# Patient Record
Sex: Female | Born: 1965 | Hispanic: Yes | Marital: Married | Smoking: Never smoker
Health system: Southern US, Community
[De-identification: ages and names within clinical notes are randomized; demographics above are authoritative.]

## PROBLEM LIST (undated history)

## (undated) DIAGNOSIS — T8859XA Other complications of anesthesia, initial encounter: Secondary | ICD-10-CM

## (undated) DIAGNOSIS — R112 Nausea with vomiting, unspecified: Secondary | ICD-10-CM

## (undated) DIAGNOSIS — R7611 Nonspecific reaction to tuberculin skin test without active tuberculosis: Secondary | ICD-10-CM

## (undated) DIAGNOSIS — Z8711 Personal history of peptic ulcer disease: Secondary | ICD-10-CM

## (undated) DIAGNOSIS — D649 Anemia, unspecified: Secondary | ICD-10-CM

## (undated) DIAGNOSIS — N393 Stress incontinence (female) (male): Secondary | ICD-10-CM

## (undated) DIAGNOSIS — J302 Other seasonal allergic rhinitis: Secondary | ICD-10-CM

## (undated) DIAGNOSIS — Z9889 Other specified postprocedural states: Secondary | ICD-10-CM

## (undated) DIAGNOSIS — K6289 Other specified diseases of anus and rectum: Secondary | ICD-10-CM

## (undated) DIAGNOSIS — R7303 Prediabetes: Secondary | ICD-10-CM

## (undated) DIAGNOSIS — E559 Vitamin D deficiency, unspecified: Secondary | ICD-10-CM

## (undated) DIAGNOSIS — K219 Gastro-esophageal reflux disease without esophagitis: Secondary | ICD-10-CM

## (undated) DIAGNOSIS — F419 Anxiety disorder, unspecified: Secondary | ICD-10-CM

## (undated) DIAGNOSIS — T4145XA Adverse effect of unspecified anesthetic, initial encounter: Secondary | ICD-10-CM

## (undated) HISTORY — DX: Vitamin D deficiency, unspecified: E55.9

## (undated) HISTORY — DX: Gastro-esophageal reflux disease without esophagitis: K21.9

## (undated) HISTORY — DX: Stress incontinence (female) (male): N39.3

## (undated) HISTORY — DX: Other specified diseases of anus and rectum: K62.89

## (undated) HISTORY — DX: Personal history of peptic ulcer disease: Z87.11

## (undated) HISTORY — PX: MIDDLE EAR SURGERY: SHX713

## (undated) HISTORY — DX: Nonspecific reaction to tuberculin skin test without active tuberculosis: R76.11

## (undated) HISTORY — DX: Other seasonal allergic rhinitis: J30.2

## (undated) HISTORY — PX: CYST EXCISION: SHX5701

---
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
## 2004-11-27 ENCOUNTER — Other Ambulatory Visit: Admission: RE | Admit: 2004-11-27 | Discharge: 2004-11-27 | Payer: Self-pay | Admitting: Gynecology

## 2005-03-13 ENCOUNTER — Encounter: Admission: RE | Admit: 2005-03-13 | Discharge: 2005-03-13 | Payer: Self-pay | Admitting: Gynecology

## 2006-06-11 ENCOUNTER — Other Ambulatory Visit: Admission: RE | Admit: 2006-06-11 | Discharge: 2006-06-11 | Payer: Self-pay | Admitting: Gynecology

## 2007-05-04 ENCOUNTER — Encounter: Admission: RE | Admit: 2007-05-04 | Discharge: 2007-05-04 | Payer: Self-pay | Admitting: Gynecology

## 2007-07-27 ENCOUNTER — Other Ambulatory Visit: Admission: RE | Admit: 2007-07-27 | Discharge: 2007-07-27 | Payer: Self-pay | Admitting: Gynecology

## 2008-05-08 ENCOUNTER — Encounter: Admission: RE | Admit: 2008-05-08 | Discharge: 2008-05-08 | Payer: Self-pay | Admitting: Gynecology

## 2008-12-08 ENCOUNTER — Encounter: Admission: RE | Admit: 2008-12-08 | Discharge: 2008-12-08 | Payer: Self-pay | Admitting: Internal Medicine

## 2010-02-01 ENCOUNTER — Encounter: Admission: RE | Admit: 2010-02-01 | Discharge: 2010-02-01 | Payer: Self-pay | Admitting: Internal Medicine

## 2010-06-02 ENCOUNTER — Encounter: Payer: Self-pay | Admitting: Internal Medicine

## 2010-08-11 DIAGNOSIS — K6289 Other specified diseases of anus and rectum: Secondary | ICD-10-CM

## 2010-08-11 HISTORY — DX: Other specified diseases of anus and rectum: K62.89

## 2010-09-05 ENCOUNTER — Other Ambulatory Visit: Payer: Self-pay | Admitting: Gastroenterology

## 2010-09-05 DIAGNOSIS — K6289 Other specified diseases of anus and rectum: Secondary | ICD-10-CM

## 2010-09-05 HISTORY — PX: COLONOSCOPY: SHX174

## 2010-09-10 ENCOUNTER — Other Ambulatory Visit: Payer: Self-pay

## 2010-09-13 ENCOUNTER — Ambulatory Visit
Admission: RE | Admit: 2010-09-13 | Discharge: 2010-09-13 | Disposition: A | Discharge status: Home / Self Care | Payer: 59 | Source: Ambulatory Visit | Attending: Gastroenterology | Admitting: Gastroenterology

## 2010-09-13 DIAGNOSIS — K6289 Other specified diseases of anus and rectum: Secondary | ICD-10-CM

## 2010-09-13 MED ORDER — IOHEXOL 300 MG/ML  SOLN
100.0000 mL | Freq: Once | INTRAMUSCULAR | Status: AC | PRN
  Administered 2010-09-13: 100 mL via INTRAVENOUS

## 2011-05-01 ENCOUNTER — Ambulatory Visit (INDEPENDENT_AMBULATORY_CARE_PROVIDER_SITE_OTHER): Payer: 59 | Admitting: Family Medicine

## 2011-05-01 ENCOUNTER — Encounter: Payer: Self-pay | Admitting: Family Medicine

## 2011-05-01 VITALS — BP 118/80 | HR 80 | Temp 98.8°F | Ht 62.0 in | Wt 195.8 lb

## 2011-05-01 DIAGNOSIS — Z23 Encounter for immunization: Secondary | ICD-10-CM

## 2011-05-01 DIAGNOSIS — R109 Unspecified abdominal pain: Secondary | ICD-10-CM

## 2011-05-01 DIAGNOSIS — K828 Other specified diseases of gallbladder: Secondary | ICD-10-CM | POA: Insufficient documentation

## 2011-05-01 DIAGNOSIS — R1011 Right upper quadrant pain: Secondary | ICD-10-CM

## 2011-05-01 DIAGNOSIS — Z8711 Personal history of peptic ulcer disease: Secondary | ICD-10-CM

## 2011-05-01 DIAGNOSIS — Z0000 Encounter for general adult medical examination without abnormal findings: Secondary | ICD-10-CM

## 2011-05-01 DIAGNOSIS — R7611 Nonspecific reaction to tuberculin skin test without active tuberculosis: Secondary | ICD-10-CM | POA: Insufficient documentation

## 2011-05-01 DIAGNOSIS — R310 Gross hematuria: Secondary | ICD-10-CM

## 2011-05-01 LAB — POCT URINALYSIS DIPSTICK
Bilirubin, UA: NEGATIVE
Glucose, UA: NEGATIVE
Ketones, UA: NEGATIVE
Leukocytes, UA: NEGATIVE
Nitrite, UA: NEGATIVE
Protein, UA: NEGATIVE
Urobilinogen, UA: 0.2
pH, UA: 6

## 2011-05-01 MED ORDER — OMEPRAZOLE 40 MG PO CPDR: 40.0000 mg | DELAYED_RELEASE_CAPSULE | Freq: Every day | ORAL | Status: DC

## 2011-05-01 NOTE — Assessment & Plan Note (Signed)
Has several day hx RUQ pain, and h/o PUD. Anticipate dyspepsia/GERD, possible gastritis/ulcer. Treat with PPI x 3 wks, will request records from [facility] ([facility]). Check blood work when returns fasting. Reassess at CPE. Not typical of biliary colic however if not improving with PPI, next step would be gallbladder eval/abd [place].

## 2011-05-01 NOTE — Patient Instructions (Addendum)
Sounds like dyspepsia treat with omeprazole 40mg  daily for 3 weeks. Return at your convenience fasting for blood work, a few days afterwards for physical. If not better, let us know for abdominal ultrasound. Flu shot today. We will request records from Dr. Dulce Sellar and prior PCP Dr. Kelly Splinter. Return off period for repeat urine to ensure no blood in urine

## 2011-05-01 NOTE — Progress Notes (Signed)
Subjective:    Patient ID: Julie Vazquez, female    DOB: 1965/08/26, 45 y.o.   MRN: [id number]  HPI CC: new pt establish  Prior saw Dr. Velna Hatchet.  Wanted to change.  R abd pain - h/o PUD, remotely.  5d h/o RUQ pain worse after drinking milk/cheese.  Sharp stabbing pain, achey.  + gassy and bloated.  No indigestion.  Mild heartburn if eating dinner too close to bedtime.  First time started Monday 9am at work, lasted whole day.  Then eased off and has had intermittently since then but not as bad.  Avoids fatty/greasy foods.  No fevers/chills, dysuria, urgency.  + weight gain.  + frequency and urgency and nocturia for several months.  Told has stress incontinence in past.  URTI last month, treated with abx, completed coughs.  Residual cough.  Wakes up with mild sputum.  Rare allergy sxs, some GERD sxs.  Preventative: No recent physical, due for this. Well woman exam yearly with OBGYN (Dr. Newt Minion), normal pap smears last 07/2010.  H/o fibroids. Mammogram coming up 04/2011 Flu shot - would like today Tetanus - UTD Colonoscopy - Dr. Dulce Sellar, for rectal pain, told normal.  H/o CT scan as well.  Medications and allergies reviewed and updated in chart.  Past histories reviewed and updated if relevant as below. There is no problem list on file for this patient.  Past Medical History  Diagnosis Date  . Anemia   . PUD (peptic ulcer disease)     remote  . Seasonal allergies   . 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 Cesarean section   . Middle ear surgery    History  Substance Use Topics  . Smoking status: Never Smoker   . Smokeless tobacco: Never Used  . Alcohol Use: Yes     Social   Family History  Problem Relation Age of Onset  . Heart disease Mother   . Diabetes Mother   . Cancer Maternal Aunt     uterine/ovarian  . Miscarriages / Stillbirths Maternal Uncle   . Diabetes Maternal  Grandmother   . Coronary artery disease Maternal Grandfather     MI  . Cancer Cousin     breast   No Known Allergies No current outpatient prescriptions on file prior to visit.   Review of Systems  Constitutional: Negative for fever, chills, activity change, appetite change, fatigue and unexpected weight change.  HENT: Negative for hearing loss and neck pain.   Respiratory: Positive for cough. Negative for chest tightness, shortness of breath and wheezing.   Cardiovascular: Positive for leg swelling. Negative for chest pain and palpitations.  Gastrointestinal: Positive for nausea and abdominal pain. Negative for vomiting, diarrhea, constipation, blood in stool and abdominal distention.  Genitourinary: Negative for hematuria and difficulty urinating.  Musculoskeletal: Negative for myalgias and arthralgias.  Skin: Negative for rash.  Neurological: Positive for headaches. Negative for dizziness, seizures and syncope.  Hematological: Does not bruise/bleed easily.  Psychiatric/Behavioral: Negative for dysphoric mood. The patient is not nervous/anxious.        Objective:   Physical Exam  Nursing note and vitals reviewed. Constitutional: She is oriented to person, place, and time. She appears well-developed and well-nourished. No distress.  HENT:  Head: Normocephalic and atraumatic.  Right Ear: External ear normal.  Left Ear: External ear normal.  Nose: Nose normal.  Mouth/Throat: Oropharynx is clear and moist.  No oropharyngeal exudate.  Eyes: Conjunctivae and EOM are normal. Pupils are equal, round, and reactive to light. No scleral icterus.  Neck: Normal range of motion. Neck supple. No thyromegaly present.  Cardiovascular: Normal rate, regular rhythm, normal heart sounds and intact distal pulses.   No murmur heard. Pulses:      Radial pulses are 2+ on the right side, and 2+ on the left side.  Pulmonary/Chest: Effort normal and breath sounds normal. No respiratory distress. She has  no wheezes. She has no rales.  Abdominal: Soft. Bowel sounds are normal. She exhibits no distension and no mass. There is no hepatosplenomegaly. There is tenderness (mild) in the right upper quadrant and epigastric area. There is no rigidity, no rebound, no guarding, no CVA tenderness and negative Murphy's sign.       No flank pain  Musculoskeletal: Normal range of motion.  Lymphadenopathy:    She has no cervical adenopathy.  Neurological: She is alert and oriented to person, place, and time.       CN grossly intact, station and gait intact  Skin: Skin is warm and dry. No rash noted.  Psychiatric: She has a normal mood and affect. Her behavior is normal. Judgment and thought content normal.      Assessment & Plan:

## 2011-05-01 NOTE — Assessment & Plan Note (Signed)
Currently on period. Recheck UA when off period. No flank pain today.

## 2011-05-07 ENCOUNTER — Other Ambulatory Visit (INDEPENDENT_AMBULATORY_CARE_PROVIDER_SITE_OTHER): Payer: 59

## 2011-05-07 DIAGNOSIS — Z0000 Encounter for general adult medical examination without abnormal findings: Secondary | ICD-10-CM

## 2011-05-07 DIAGNOSIS — R1011 Right upper quadrant pain: Secondary | ICD-10-CM

## 2011-05-07 LAB — CBC WITH DIFFERENTIAL/PLATELET
Basophils Absolute: 0 10*3/uL (ref 0.0–0.1)
Basophils Relative: 0.6 % (ref 0.0–3.0)
Eosinophils Absolute: 0.2 10*3/uL (ref 0.0–0.7)
Eosinophils Relative: 2.4 % (ref 0.0–5.0)
HCT: 38.1 % (ref 36.0–46.0)
Hemoglobin: 13 g/dL (ref 12.0–15.0)
Lymphocytes Relative: 44.8 % (ref 12.0–46.0)
Lymphs Abs: 3.6 10*3/uL (ref 0.7–4.0)
MCHC: 34 g/dL (ref 30.0–36.0)
MCV: 92.2 fl (ref 78.0–100.0)
Monocytes Absolute: 0.6 10*3/uL (ref 0.1–1.0)
Monocytes Relative: 7 % (ref 3.0–12.0)
Neutro Abs: 3.7 10*3/uL (ref 1.4–7.7)
Neutrophils Relative %: 45.2 % (ref 43.0–77.0)
Platelets: 273 10*3/uL (ref 150.0–400.0)
RBC: 4.14 Mil/uL (ref 3.87–5.11)
RDW: 12.9 % (ref 11.5–14.6)
WBC: 8.1 10*3/uL (ref 4.5–10.5)

## 2011-05-07 LAB — TSH: TSH: 0.89 u[IU]/mL (ref 0.35–5.50)

## 2011-05-07 LAB — LIPID PANEL
Cholesterol: 158 mg/dL (ref 0–200)
HDL: 90.4 mg/dL (ref >39.00)
LDL Cholesterol: 55 mg/dL (ref 0–99)
Total CHOL/HDL Ratio: 2
Triglycerides: 65 mg/dL (ref 0.0–149.0)
VLDL: 13 mg/dL (ref 0.0–40.0)

## 2011-05-07 LAB — COMPREHENSIVE METABOLIC PANEL
ALT: 16 U/L (ref 0–35)
AST: 19 U/L (ref 0–37)
Albumin: 3.6 g/dL (ref 3.5–5.2)
Alkaline Phosphatase: 71 U/L (ref 39–117)
BUN: 18 mg/dL (ref 6–23)
CO2: 28 mEq/L (ref 19–32)
Calcium: 9 mg/dL (ref 8.4–10.5)
Chloride: 105 mEq/L (ref 96–112)
Creatinine, Ser: 0.7 mg/dL (ref 0.4–1.2)
GFR: 90.96 mL/min (ref >60.00)
Glucose, Bld: 102 mg/dL — ABNORMAL HIGH (ref 70–99)
Potassium: 4.2 mEq/L (ref 3.5–5.1)
Sodium: 139 mEq/L (ref 135–145)
Total Bilirubin: 0.4 mg/dL (ref 0.3–1.2)
Total Protein: 7.6 g/dL (ref 6.0–8.3)

## 2011-05-07 LAB — LIPASE: Lipase: 25 U/L (ref 11.0–59.0)

## 2011-05-12 ENCOUNTER — Ambulatory Visit: Payer: 59 | Admitting: Family Medicine

## 2011-05-18 ENCOUNTER — Encounter: Payer: Self-pay | Admitting: Family Medicine

## 2011-05-30 ENCOUNTER — Ambulatory Visit (INDEPENDENT_AMBULATORY_CARE_PROVIDER_SITE_OTHER): Payer: 59 | Admitting: Family Medicine

## 2011-05-30 ENCOUNTER — Encounter: Payer: Self-pay | Admitting: Family Medicine

## 2011-05-30 VITALS — BP 124/78 | HR 76 | Temp 98.5°F | Wt 196.2 lb

## 2011-05-30 DIAGNOSIS — R1011 Right upper quadrant pain: Secondary | ICD-10-CM

## 2011-05-30 DIAGNOSIS — R7301 Impaired fasting glucose: Secondary | ICD-10-CM

## 2011-05-30 DIAGNOSIS — R319 Hematuria, unspecified: Secondary | ICD-10-CM

## 2011-05-30 DIAGNOSIS — D179 Benign lipomatous neoplasm, unspecified: Secondary | ICD-10-CM

## 2011-05-30 DIAGNOSIS — Z23 Encounter for immunization: Secondary | ICD-10-CM

## 2011-05-30 DIAGNOSIS — R310 Gross hematuria: Secondary | ICD-10-CM

## 2011-05-30 DIAGNOSIS — Z0001 Encounter for general adult medical examination with abnormal findings: Secondary | ICD-10-CM | POA: Insufficient documentation

## 2011-05-30 DIAGNOSIS — Z0000 Encounter for general adult medical examination without abnormal findings: Secondary | ICD-10-CM

## 2011-05-30 LAB — POCT URINALYSIS DIPSTICK
Bilirubin, UA: NEGATIVE
Blood, UA: NEGATIVE
Glucose, UA: NEGATIVE
Ketones, UA: NEGATIVE
Leukocytes, UA: NEGATIVE
Nitrite, UA: NEGATIVE
Protein, UA: NEGATIVE
Spec Grav, UA: 1.015
Urobilinogen, UA: 0.2
pH, UA: 7

## 2011-05-30 NOTE — Assessment & Plan Note (Signed)
Resolved.  Likely due to period.

## 2011-05-30 NOTE — Assessment & Plan Note (Signed)
Reviewed preventative protocols and updated unless pt declined. Flu last visit. Will call us or bring records of last tetanus. Well woman with OBGYN, due for mammogram, encouraged to schedule. Discussed healthy lifestyle and dietary changes.  Reviewed blood work in detail - mildly elevated glu.

## 2011-05-30 NOTE — Assessment & Plan Note (Signed)
improved on PPI. Anticipate dyspepsia. Has changed diet, encouraged continue with this. If returning, would obtain Abd [place] to eval gallstones vs re start PPI.

## 2011-05-30 NOTE — Assessment & Plan Note (Signed)
Reassured. Pt desires removal,but will call us when she wants referral to surgery.  (currently wants to hold off).

## 2011-05-30 NOTE — Patient Instructions (Signed)
Gusto verla hoy. Dejeme saber si empieza a molestarle el estomago para sonograma. Dejenos saber cuando quiere referral a cirugano para el lipoma en la espalda. Chequeamos orina hoy. llamenos con preguntas. regresar como necesite. monitoree azucar - regreso un poco alta.

## 2011-05-30 NOTE — Progress Notes (Signed)
Subjective:    Patient ID: Julie Vazquez, female    DOB: 1966/05/12, 46 y.o.   MRN: [id number]  HPI CC: 1 mo f/u RUQ pain, actually wants CPE today.  Seen last month with RUQ pain, thought GERD vs dyspepsia related.  (woke up from sleep with sharp RUQ pain).  Placed on omeprazole 40mg  daily for 3-4 wks, sxs resolved with this.  Also changed diet: stopped spicy, greasy foods.  Stopped eating late at night.    No pain currently.  No fevers/chills, abd pain.  Due for CPE ,would like today.  LMP 05/25/2011.  Not currently on period.  Lesion on back wants checked.  Would like removed.  H/o keloid  Wt Readings from Last 3 Encounters:  05/30/11 196 lb 4 oz (89.018 kg)  05/01/11 195 lb 12 oz (88.792 kg)   Preventative:  Well woman exam yearly with OBGYN (Dr. Newt Minion), normal pap smears last 07/2010. H/o fibroids, he is following her for this. Mammogram due, will call breast center to schedule. Flu shot - last visit. Tetanus - UTD  Colonoscopy - 2012 by Dr. Dulce Sellar, for rectal pain, see PMH.  CT pelvis, not abd done. 100% seat belt use  Medications and allergies reviewed and updated in chart.  Past histories reviewed and updated if relevant as below. Patient Active Problem List  Diagnoses  . History of peptic ulcer disease  . Positive TB test  . RUQ pain  . Hematuria, gross   Past Medical History  Diagnosis Date  . History of anemia   . History of peptic ulcer disease     remote  . Seasonal allergies   . 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 SUI (stress urinary incontinence, female)     ? told has this in past.  . Rectal mass 08/2010    colonoscopy - submucosal bulge but no lesion (?from retroverted uterus) ([facility])   Past Surgical History  Procedure Date  . Cesarean section   . Middle ear surgery 2000s  . Colonoscopy 09/05/2010    nodular prominence anterior, mobile, likely compression from retoverted uterus   History    Substance Use Topics  . Smoking status: Never Smoker   . Smokeless tobacco: Never Used  . Alcohol Use: Yes     Social   Family History  Problem Relation Age of Onset  . Heart disease Mother   . Diabetes Mother   . Cancer Maternal Aunt     uterine/ovarian  . Miscarriages / Stillbirths Maternal Uncle   . Diabetes Maternal Grandmother   . Coronary artery disease Maternal Grandfather     MI  . Cancer Cousin     breast   No Known Allergies Current Outpatient Prescriptions on File Prior to Visit  Medication Sig Dispense Refill  . Multiple Vitamins-Minerals (MULTIVITAMIN PO) Take by mouth.        Marland Kitchen omeprazole (PRILOSEC) 40 MG capsule Take 1 capsule (40 mg total) by mouth daily.  30 capsule  0    Review of Systems  Constitutional: Negative for fever, chills, activity change, appetite change, fatigue and unexpected weight change.  HENT: Negative for hearing loss and neck pain.   Eyes: Negative for visual disturbance.  Respiratory: Negative for cough, chest tightness, shortness of breath and wheezing.   Cardiovascular: Negative for chest pain, palpitations and leg swelling.  Gastrointestinal: Negative for nausea, vomiting, abdominal pain, diarrhea, constipation, blood in stool and abdominal distention.  Genitourinary:  Negative for hematuria and difficulty urinating.  Musculoskeletal: Negative for myalgias and arthralgias.  Skin: Negative for rash.  Neurological: Negative for dizziness, seizures, syncope and headaches.  Hematological: Does not bruise/bleed easily.  Psychiatric/Behavioral: Negative for dysphoric mood. The patient is not nervous/anxious.       Objective:   Physical Exam  Nursing note and vitals reviewed. Constitutional: She is oriented to person, place, and time. She appears well-developed and well-nourished. No distress.  HENT:  Head: Normocephalic and atraumatic.  Right Ear: External ear normal.  Left Ear: External ear normal.  Nose: Nose normal.   Mouth/Throat: Oropharynx is clear and moist. No oropharyngeal exudate.  Eyes: Conjunctivae and EOM are normal. Pupils are equal, round, and reactive to light. No scleral icterus.  Neck: Normal range of motion. Neck supple.  Cardiovascular: Normal rate, regular rhythm, normal heart sounds and intact distal pulses.   No murmur heard. Pulses:      Radial pulses are 2+ on the right side, and 2+ on the left side.  Pulmonary/Chest: Effort normal and breath sounds normal. No respiratory distress. She has no wheezes. She has no rales.  Abdominal: Soft. Bowel sounds are normal. She exhibits no distension and no mass. There is no tenderness. There is no rebound and no guarding.  Musculoskeletal: Normal range of motion.  Lymphadenopathy:    She has no cervical adenopathy.  Neurological: She is alert and oriented to person, place, and time.       CN grossly intact, station and gait intact  Skin: Skin is warm and dry. No rash noted.       Left mid upper back with rubbery well circumscribed lesion about 1.5 in diameter  Psychiatric: She has a normal mood and affect. Her behavior is normal. Judgment and thought content normal.       Assessment & Plan:

## 2012-12-24 ENCOUNTER — Encounter: Payer: Self-pay | Admitting: Family Medicine

## 2012-12-24 ENCOUNTER — Ambulatory Visit (INDEPENDENT_AMBULATORY_CARE_PROVIDER_SITE_OTHER): Payer: No Typology Code available for payment source | Admitting: Family Medicine

## 2012-12-24 VITALS — BP 126/86 | HR 84 | Temp 98.6°F | Ht 62.0 in | Wt 179.0 lb

## 2012-12-24 DIAGNOSIS — R7301 Impaired fasting glucose: Secondary | ICD-10-CM

## 2012-12-24 DIAGNOSIS — E559 Vitamin D deficiency, unspecified: Secondary | ICD-10-CM | POA: Insufficient documentation

## 2012-12-24 DIAGNOSIS — K219 Gastro-esophageal reflux disease without esophagitis: Secondary | ICD-10-CM

## 2012-12-24 DIAGNOSIS — E669 Obesity, unspecified: Secondary | ICD-10-CM

## 2012-12-24 DIAGNOSIS — Z0000 Encounter for general adult medical examination without abnormal findings: Secondary | ICD-10-CM

## 2012-12-24 MED ORDER — OMEPRAZOLE 40 MG PO CPDR: 40.0000 mg | DELAYED_RELEASE_CAPSULE | Freq: Every day | ORAL | Status: DC

## 2012-12-24 NOTE — Progress Notes (Signed)
Subjective:    Patient ID: Julie Vazquez, female    DOB: 28-Oct-1965, 47 y.o.   MRN: [id number]  HPI CC: CPE  Wt Readings from Last 3 Encounters:  12/24/12 179 lb (81.194 kg)  05/30/11 196 lb 4 oz (89.018 kg)  05/01/11 195 lb 12 oz (88.792 kg)  Body mass index is 32.73 kg/(m^2).   Not fasting today.  GERD - diet controlled.  Omeprazole prn helps.  Improved with weight loss.  Notes worsens with spicy food.  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 Dulce Vazquez, for rectal pain.   Well woman exam yearly with OBGYN (Dr. [name]), normal pap smears last 07/2010. Due for this.  H/o fibroids, he is following her for this.  Mammogram - due. Last was 2012 and normal Flu shot - 05/2011 Tetanus - UTD per patient   100% seat belt use  Medications and allergies reviewed and updated in chart.  Past histories reviewed and updated if relevant as below. Patient Active Problem List   Diagnosis Date Noted  . Obesity 12/24/2012  . Vitamin D deficiency   . Healthcare maintenance 05/30/2011  . Impaired fasting blood sugar 05/30/2011  . 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 RUQ pain 05/01/2011  . History of peptic ulcer disease   . Positive TB test    Past Medical History  Diagnosis Date  . History of anemia   . History of peptic ulcer disease     remote  . Seasonal allergies   . 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 SUI (stress urinary incontinence, female)     ? told has this in past.  . Rectal mass 08/2010    colonoscopy - submucosal bulge but no lesion (?from retroverted uterus) (Julie Vazquez)  . Vitamin D deficiency    Past Surgical History  Procedure Laterality Date  . Cesarean section    . Middle ear surgery  2000s  . Colonoscopy   09/05/2010    nodular prominence anterior, mobile, likely compression from retoverted uterus   History  Substance Use Topics  . Smoking status: Never Smoker   . Smokeless tobacco: Never Used  . Alcohol Use: Yes     Comment: Social   Family History  Problem Relation Age of Onset  . Hypertension Mother   . Diabetes Mother   . Cancer Maternal Aunt     ovarian  . Miscarriages / Stillbirths Maternal Uncle   . Diabetes Maternal Grandmother   . Coronary artery disease Maternal Grandfather     MI  . Cancer Cousin     breast  . Stroke Maternal Uncle   . Stroke Brother    No Known Allergies Current Outpatient Prescriptions on File Prior to Visit  Medication Sig Dispense Refill  . Multiple Vitamins-Minerals (MULTIVITAMIN PO) Take by mouth.         No current facility-administered medications on file prior to visit.    Review of Systems  Constitutional: Negative for fever, chills, activity change, appetite change, fatigue and unexpected weight change.  HENT: Positive for congestion (recent sinusitis). Negative for hearing loss and neck pain.   Eyes: Negative for visual disturbance.  Respiratory: Negative for cough, chest tightness, shortness of breath and wheezing.   Cardiovascular: Negative for chest pain, palpitations and leg swelling.  Gastrointestinal: Negative for nausea, vomiting, abdominal pain, diarrhea, constipation, blood in stool and abdominal distention.  Genitourinary: Negative for hematuria and difficulty urinating.  Musculoskeletal: Negative for myalgias and arthralgias.  Skin: Negative for rash.  Neurological: Positive for headaches (sinusitis). Negative for dizziness, seizures and syncope.  Hematological: Negative for adenopathy. Does not bruise/bleed easily.  Psychiatric/Behavioral: Negative for dysphoric mood. The patient is not nervous/anxious.        Objective:   Physical Exam  Nursing note and vitals reviewed. Constitutional: She is oriented to person,  place, and time. She appears well-developed and well-nourished. No distress.  HENT:  Head: Normocephalic and atraumatic.  Right Ear: Hearing, external ear and ear canal normal.  Left Ear: Hearing, tympanic membrane, external ear and ear canal normal.  Nose: Nose normal.  Mouth/Throat: Oropharynx is clear and moist. No oropharyngeal exudate.  Chronic L TM deformity   Eyes: Conjunctivae and EOM are normal. Pupils are equal, round, and reactive to light. No scleral icterus.  Neck: Normal range of motion. Neck supple. No thyromegaly present.  Cardiovascular: Normal rate, regular rhythm, normal heart sounds and intact distal pulses.   No murmur heard. Pulses:      Radial pulses are 2+ on the right side, and 2+ on the left side.  Pulmonary/Chest: Effort normal and breath sounds normal. No respiratory distress. She has no wheezes. She has no rales.  Abdominal: Soft. Bowel sounds are normal. She exhibits no distension and no mass. There is no tenderness. There is no rebound and no guarding.  Musculoskeletal: Normal range of motion. She exhibits no edema.  Lymphadenopathy:    She has no cervical adenopathy.  Neurological: She is alert and oriented to person, place, and time.  CN grossly intact, station and gait intact  Skin: Skin is warm and dry. No rash noted.  Psychiatric: She has a normal mood and affect. Her behavior is normal. Judgment and thought content normal.       Assessment & Plan:

## 2012-12-24 NOTE — Assessment & Plan Note (Signed)
Check bmp when returns fasting.  Anticipate improvement with weight loss and healthier diet.

## 2012-12-24 NOTE — Assessment & Plan Note (Signed)
Preventative protocols reviewed and updated unless pt declined. Discussed healthy diet and lifestyle. I've asked her to verify latest tetanus shot. Encouraged schedule mammogram.

## 2012-12-24 NOTE — Assessment & Plan Note (Signed)
Body mass index is 32.73 kg/(m^2).  Congratulated on weight loss and recommended she continue healthy diet/lifestyle changes.

## 2012-12-24 NOTE — Assessment & Plan Note (Signed)
Recheck at next blood work.

## 2012-12-24 NOTE — Patient Instructions (Signed)
revisa en el trabajo a ver cuando fue la ultima vacuna contra tetano y si fue Td o Tdap regresa en 1 semana para revisar sangre. Gusto verla hoy.  llamenos con preguntas. regrese en 1 ano para proximo examen fisico.

## 2012-12-24 NOTE — Assessment & Plan Note (Signed)
Controlled with diet, weight loss, and ppi prn.

## 2012-12-31 ENCOUNTER — Other Ambulatory Visit (INDEPENDENT_AMBULATORY_CARE_PROVIDER_SITE_OTHER): Payer: No Typology Code available for payment source

## 2012-12-31 DIAGNOSIS — Z0000 Encounter for general adult medical examination without abnormal findings: Secondary | ICD-10-CM

## 2012-12-31 DIAGNOSIS — E669 Obesity, unspecified: Secondary | ICD-10-CM

## 2012-12-31 DIAGNOSIS — E559 Vitamin D deficiency, unspecified: Secondary | ICD-10-CM

## 2012-12-31 DIAGNOSIS — R7301 Impaired fasting glucose: Secondary | ICD-10-CM

## 2012-12-31 LAB — TSH: TSH: 0.66 u[IU]/mL (ref 0.35–5.50)

## 2012-12-31 LAB — BASIC METABOLIC PANEL
BUN: 17 mg/dL (ref 6–23)
CO2: 28 mEq/L (ref 19–32)
Calcium: 9.2 mg/dL (ref 8.4–10.5)
Chloride: 103 mEq/L (ref 96–112)
Creatinine, Ser: 0.8 mg/dL (ref 0.4–1.2)
GFR: 86.21 mL/min (ref >60.00)
Glucose, Bld: 106 mg/dL — ABNORMAL HIGH (ref 70–99)
Potassium: 4.1 mEq/L (ref 3.5–5.1)
Sodium: 136 mEq/L (ref 135–145)

## 2013-01-01 LAB — VITAMIN D 25 HYDROXY (VIT D DEFICIENCY, FRACTURES): Vit D, 25-Hydroxy: 52 ng/mL (ref 30–89)

## 2013-01-19 ENCOUNTER — Telehealth: Payer: Self-pay | Admitting: *Deleted

## 2013-01-19 ENCOUNTER — Ambulatory Visit: Payer: No Typology Code available for payment source | Admitting: Internal Medicine

## 2013-01-19 NOTE — Telephone Encounter (Signed)
Patient called and woke up with fever,sinus pressure/congestion this AM and requested appt for today per front desk. Only appts available were this afternoon with Nicki Reaper, NP. Pt advised of same and said she would have to call back once she checked with her employer.

## 2013-01-19 NOTE — Telephone Encounter (Signed)
We could open up a noon slot on my schedule at 12:15 or 12:30 if desired.

## 2013-08-15 ENCOUNTER — Other Ambulatory Visit: Payer: Self-pay

## 2013-08-15 DIAGNOSIS — Z1231 Encounter for screening mammogram for malignant neoplasm of breast: Secondary | ICD-10-CM

## 2013-08-18 ENCOUNTER — Other Ambulatory Visit: Payer: Self-pay | Admitting: Family Medicine

## 2013-08-18 ENCOUNTER — Ambulatory Visit
Admission: RE | Admit: 2013-08-18 | Discharge: 2013-08-18 | Disposition: A | Discharge status: Home / Self Care | Payer: No Typology Code available for payment source | Source: Ambulatory Visit

## 2013-08-18 ENCOUNTER — Ambulatory Visit
Admission: RE | Admit: 2013-08-18 | Discharge: 2013-08-18 | Disposition: A | Discharge status: Home / Self Care | Payer: No Typology Code available for payment source | Source: Ambulatory Visit | Attending: Family Medicine | Admitting: Family Medicine

## 2013-08-18 DIAGNOSIS — N644 Mastodynia: Secondary | ICD-10-CM

## 2013-08-18 DIAGNOSIS — Z1231 Encounter for screening mammogram for malignant neoplasm of breast: Secondary | ICD-10-CM

## 2013-08-18 LAB — HM MAMMOGRAPHY: HM Mammogram: NORMAL

## 2013-08-22 ENCOUNTER — Encounter: Payer: Self-pay | Admitting: *Deleted

## 2013-08-26 ENCOUNTER — Encounter: Payer: Self-pay | Admitting: Internal Medicine

## 2013-08-26 ENCOUNTER — Ambulatory Visit (INDEPENDENT_AMBULATORY_CARE_PROVIDER_SITE_OTHER): Payer: No Typology Code available for payment source | Admitting: Internal Medicine

## 2013-08-26 VITALS — BP 120/80 | HR 80 | Temp 97.9°F | Wt 185.0 lb

## 2013-08-26 DIAGNOSIS — T3695XA Adverse effect of unspecified systemic antibiotic, initial encounter: Secondary | ICD-10-CM

## 2013-08-26 DIAGNOSIS — B379 Candidiasis, unspecified: Secondary | ICD-10-CM

## 2013-08-26 DIAGNOSIS — J329 Chronic sinusitis, unspecified: Secondary | ICD-10-CM

## 2013-08-26 MED ORDER — FLUCONAZOLE 150 MG PO TABS: 150.0000 mg | ORAL_TABLET | Freq: Once | ORAL | Status: DC

## 2013-08-26 MED ORDER — AMOXICILLIN-POT CLAVULANATE 875-125 MG PO TABS: 1.0000 | ORAL_TABLET | Freq: Two times a day (BID) | ORAL | Status: DC

## 2013-08-26 NOTE — Patient Instructions (Addendum)

## 2013-08-26 NOTE — Progress Notes (Signed)
Pre visit review using our clinic review tool, if applicable. No additional management support is needed unless otherwise documented below in the visit note. 

## 2013-08-26 NOTE — Progress Notes (Signed)
HPI  Pt presents to the clinic today with c/o sinus pressure, teeth pan, sore throat, nasal congestions and fevers. She reports this started 8 days ago. She denies chills or body aches. She has treid Human resources officer, vicks, sudafed, advil cold and sinus without and relief. She does have a history of seasonal allergies. She has not had sick contacts. She is not a current smoker.  Review of Systems    Past Medical History  Diagnosis Date  . History of peptic ulcer disease     remote  . Seasonal allergies   . 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 SUI (stress urinary incontinence, female)     ? told has this in past.  . Rectal mass 08/2010    colonoscopy - submucosal bulge but no lesion (?from retroverted uterus) ([facility])  . Vitamin D deficiency   . GERD (gastroesophageal reflux disease)     Family History  Problem Relation Age of Onset  . Hypertension Mother   . Diabetes Mother   . Cancer Maternal Aunt     ovarian  . Miscarriages / Stillbirths Maternal Uncle   . Diabetes Maternal Grandmother   . Coronary artery disease Maternal Grandfather     MI  . Cancer Cousin     breast  . Stroke Maternal Uncle   . Stroke Brother     History   Social History  . Marital Status: Married    Spouse Name: N/A    Number of Children: N/A  . Years of Education: N/A   Occupational History  . Not on file.   Social History Main Topics  . Smoking status: Never Smoker   . Smokeless tobacco: Never Used  . Alcohol Use: Yes     Comment: Social  . Drug Use: No  . Sexual Activity: Not on file   Other Topics Concern  . Not on file   Social History Narrative   Dominican   Caffeine: 1-2 cups coffee/day   Lives with husband and 2 children   Occupation: Copywriter, advertising   Activity: wants to start going to gym   Diet: bread, fruits/vegetables daily, good water, red meat occasional, 2x/wk fish    No Known Allergies   Constitutional: Positive headache,  fatigue and fever. Denies abrupt weight changes.  HEENT:  Positive eye pain, pressure behind the eyes, facial pain, nasal congestion and sore throat. Denies eye redness, ear pain, ringing in the ears, wax buildup, runny nose or bloody nose. Respiratory: Positive cough. Denies difficulty breathing or shortness of breath.  Cardiovascular: Denies chest pain, chest tightness, palpitations or swelling in the hands or feet.   No other specific complaints in a complete review of systems (except as listed in HPI above).  Objective:  BP 120/80  Pulse 80  Temp(Src) 97.9 F (36.6 C) (Tympanic)  Wt 185 lb (83.915 kg)  SpO2 98%   General: Appears her stated age, well developed, well nourished in NAD. HEENT: Head: normal shape and size, sinus tenderness noted; Eyes: sclera white, no icterus, conjunctiva pink, PERRLA and EOMs intact; Ears: Tm's gray and intact, normal light reflex; Nose: mucosa pink and moist, septum midline; Throat/Mouth: + PND. Teeth present, mucosa pink and moist, no exudate noted, no lesions or ulcerations noted.  Neck: Neck supple, trachea midline. No massses, lumps or thyromegaly present.  Cardiovascular: Normal rate and rhythm. S1,S2 noted.  No murmur, rubs or gallops noted. No JVD or BLE edema. No carotid  bruits noted. Pulmonary/Chest: Normal effort and positive vesicular breath sounds. No respiratory distress. No wheezes, rales or ronchi noted.      Assessment & Plan:   Acute bacterial sinusitis  Can use a Neti Pot which can be purchased from your local drug store. Continue saline nasal spray, she declines flonase Augmentin BID for 10 days eRx diflucan 150 mg PO x 1  RTC as needed or if symptoms persist.

## 2014-01-07 ENCOUNTER — Other Ambulatory Visit: Payer: Self-pay | Admitting: Family Medicine

## 2014-02-10 ENCOUNTER — Encounter: Payer: Self-pay | Admitting: Family Medicine

## 2014-02-10 ENCOUNTER — Ambulatory Visit (INDEPENDENT_AMBULATORY_CARE_PROVIDER_SITE_OTHER): Payer: No Typology Code available for payment source | Admitting: Family Medicine

## 2014-02-10 VITALS — BP 128/76 | HR 85 | Temp 98.2°F | Wt 199.8 lb

## 2014-02-10 DIAGNOSIS — R079 Chest pain, unspecified: Secondary | ICD-10-CM

## 2014-02-10 DIAGNOSIS — K219 Gastro-esophageal reflux disease without esophagitis: Secondary | ICD-10-CM

## 2014-02-10 LAB — CBC WITH DIFFERENTIAL/PLATELET
Basophils Absolute: 0 K/uL (ref 0.0–0.1)
Basophils Relative: 0.5 % (ref 0.0–3.0)
Eosinophils Absolute: 0.2 K/uL (ref 0.0–0.7)
Eosinophils Relative: 2.4 % (ref 0.0–5.0)
HCT: 38.3 % (ref 36.0–46.0)
Hemoglobin: 12.8 g/dL (ref 12.0–15.0)
Lymphocytes Relative: 41.7 % (ref 12.0–46.0)
Lymphs Abs: 3.1 K/uL (ref 0.7–4.0)
MCHC: 33.4 g/dL (ref 30.0–36.0)
MCV: 91.1 fl (ref 78.0–100.0)
Monocytes Absolute: 0.6 K/uL (ref 0.1–1.0)
Monocytes Relative: 7.5 % (ref 3.0–12.0)
Neutro Abs: 3.6 K/uL (ref 1.4–7.7)
Neutrophils Relative %: 47.9 % (ref 43.0–77.0)
Platelets: 299 K/uL (ref 150.0–400.0)
RBC: 4.21 Mil/uL (ref 3.87–5.11)
RDW: 13.6 % (ref 11.5–15.5)
WBC: 7.5 K/uL (ref 4.0–10.5)

## 2014-02-10 LAB — COMPREHENSIVE METABOLIC PANEL
ALT: 20 U/L (ref 0–35)
AST: 20 U/L (ref 0–37)
Albumin: 3.9 g/dL (ref 3.5–5.2)
Alkaline Phosphatase: 80 U/L (ref 39–117)
BUN: 17 mg/dL (ref 6–23)
CO2: 28 mEq/L (ref 19–32)
Calcium: 9.3 mg/dL (ref 8.4–10.5)
Chloride: 102 mEq/L (ref 96–112)
Creatinine, Ser: 0.7 mg/dL (ref 0.4–1.2)
GFR: 89.9 mL/min (ref >60.00)
Glucose, Bld: 105 mg/dL — ABNORMAL HIGH (ref 70–99)
Potassium: 4 mEq/L (ref 3.5–5.1)
Sodium: 136 mEq/L (ref 135–145)
Total Bilirubin: 0.3 mg/dL (ref 0.2–1.2)
Total Protein: 8.4 g/dL — ABNORMAL HIGH (ref 6.0–8.3)

## 2014-02-10 LAB — TSH: TSH: 0.45 u[IU]/mL (ref 0.35–4.50)

## 2014-02-10 LAB — LIPID PANEL
Cholesterol: 178 mg/dL (ref 0–200)
HDL: 101.8 mg/dL (ref >39.00)
LDL Cholesterol: 65 mg/dL (ref 0–99)
NonHDL: 76.2
Total CHOL/HDL Ratio: 2
Triglycerides: 55 mg/dL (ref 0.0–149.0)
VLDL: 11 mg/dL (ref 0.0–40.0)

## 2014-02-10 MED ORDER — OMEPRAZOLE 40 MG PO CPDR: DELAYED_RELEASE_CAPSULE | ORAL | Status: DC

## 2014-02-10 NOTE — Progress Notes (Signed)
BP 128/76  Pulse 85  Temp(Src) 98.2 F (36.8 C) (Oral)  Wt 199 lb 12 oz (90.606 kg)  SpO2 96%  LMP 02/03/2014   CC: HTN, chest pain  Subjective:    Patient ID: Julie Vazquez, female    DOB: [birth year], 48 y.o.   MRN: [id number]  HPI: Julie Vazquez is a 48 y.o. female presenting on 02/10/2014 for Hypertension and Chest Pain   Episode of substernal chest pain described as squeezing occurred 5d ago over Sunday night that lasted 30 min, seemed to improve after taking omeprazole and laying down and resting. Pain recurred Monday while seated at work - lasted several hours.  bp was elevated at 150/110s. Pain not exertional. Denies nausea or dyspnea. Ate bread with cheese for diner the night prior to discomfort.  She has noticed increased GERD sxs. Continues omeprazole 40mg  daily. Occasional spicy foods, but avoids greasy foods.  Increased stress at home - wonders about anxiety. She is a Research officer, trade [employer].   Wt Readings from Last 3 Encounters:  02/10/14 199 lb 12 oz (90.606 kg)  08/26/13 185 lb (83.915 kg)  12/24/12 179 lb (81.194 kg)   Body mass index is 36.53 kg/(m^2). Weight gain noted - increased 15 lbs over last few months. No current regular exercise.   Nonsmoker. No h/o HTN, HLD. fmhx CAD - grandfather with MI at older age.  Planning on taking trip to visit mother this week in [place]. Some stress over this.  Relevant past medical, surgical, family and social history reviewed and updated as indicated.  Allergies and medications reviewed and updated. Current Outpatient Prescriptions on File Prior to Visit  Medication Sig  . cholecalciferol (VITAMIN D) 1000 UNITS tablet Take 1,000 Units by mouth daily.  . Multiple Vitamins-Minerals (MULTIVITAMIN PO) Take by mouth.     No current facility-administered medications on file prior to visit.    Review of Systems Per HPI unless specifically indicated above    Objective:    BP 128/76  Pulse 85  Temp(Src) 98.2 F (36.8 C)  (Oral)  Wt 199 lb 12 oz (90.606 kg)  SpO2 96%  LMP 02/03/2014  Physical Exam  Nursing note and vitals reviewed. Constitutional: She appears well-developed and well-nourished. No distress.  HENT:  Mouth/Throat: Oropharynx is clear and moist. No oropharyngeal exudate.  Eyes: Conjunctivae and EOM are normal. Pupils are equal, round, and reactive to light.  Cardiovascular: Normal rate, regular rhythm, normal heart sounds and intact distal pulses.   No murmur heard. Pulmonary/Chest: Effort normal and breath sounds normal. No respiratory distress. She has no wheezes. She has no rales.  Musculoskeletal: She exhibits no edema.  Psychiatric: She has a normal mood and affect.   Results for orders placed in visit on 08/22/13  HM MAMMOGRAPHY      Result Value Ref Range   HM Mammogram Birads 1-Normal-Repeat 1 year        Assessment & Plan:   Problem List Items Addressed This Visit   Pain in the chest - Primary     Overall atypical chest pain over the past week, not exertional. Check EKG today.  Anticipate GERD /anxiety related, not CAD related. No strong fmhx, no significant risk factors other than obesity. Will increase omeprazole to 40mg  bid for next 3 weeks, pt will update me with effect. If persistent chest pain, consider cards eval. bp elevation ?stress related. EKG - NSR rate 75, normal axis, intervals, no acute ST/T changes    Relevant Orders  EKG 12-Lead (Completed)      Lipid panel      Comprehensive metabolic panel      TSH      CBC with Differential   GERD (gastroesophageal reflux disease)     Anticipate chest pain GERD related - see above. Increase omeprazole to 40mg  bid for next several weeks. H/o PUD.    Relevant Medications      omeprazole (PRILOSEC) capsule       Follow up plan: Return if symptoms worsen or fail to improve.

## 2014-02-10 NOTE — Progress Notes (Signed)
Pre visit review using our clinic review tool, if applicable. No additional management support is needed unless otherwise documented below in the visit note. 

## 2014-02-10 NOTE — Assessment & Plan Note (Signed)
Anticipate chest pain GERD related - see above. Increase omeprazole to 40mg  bid for next several weeks. H/o PUD.

## 2014-02-10 NOTE — Patient Instructions (Signed)
Colace para estenimiento. creo que este dolor es mas de estomago y reflujo. Tome omeprazole 40mg  dose veces al dia. si no mejora con esto dejeme saber. tambien stess puede causar sintomas.

## 2014-02-10 NOTE — Assessment & Plan Note (Addendum)
Overall atypical chest pain over the past week, not exertional. Check EKG today.  Anticipate GERD /anxiety related, not CAD related. No strong fmhx, no significant risk factors other than obesity. Will increase omeprazole to 40mg  bid for next 3 weeks, pt will update me with effect. If persistent chest pain, consider cards eval. bp elevation ?stress related. EKG - NSR rate 75, normal axis, intervals, no acute ST/T changes

## 2014-02-13 ENCOUNTER — Encounter: Payer: Self-pay | Admitting: *Deleted

## 2014-03-30 ENCOUNTER — Encounter: Payer: Self-pay | Admitting: Family Medicine

## 2014-03-30 ENCOUNTER — Ambulatory Visit (INDEPENDENT_AMBULATORY_CARE_PROVIDER_SITE_OTHER): Payer: No Typology Code available for payment source | Admitting: Family Medicine

## 2014-03-30 VITALS — BP 110/82 | HR 93 | Temp 98.4°F | Ht 62.0 in | Wt 196.0 lb

## 2014-03-30 DIAGNOSIS — J0100 Acute maxillary sinusitis, unspecified: Secondary | ICD-10-CM | POA: Insufficient documentation

## 2014-03-30 DIAGNOSIS — H6501 Acute serous otitis media, right ear: Secondary | ICD-10-CM

## 2014-03-30 MED ORDER — AMOXICILLIN-POT CLAVULANATE 875-125 MG PO TABS: 1.0000 | ORAL_TABLET | Freq: Two times a day (BID) | ORAL | Status: AC

## 2014-03-30 NOTE — Progress Notes (Signed)
Pre visit review using our clinic review tool, if applicable. No additional management support is needed unless otherwise documented below in the visit note. 

## 2014-03-30 NOTE — Assessment & Plan Note (Signed)
Anticipate acute maxillary sinusitis with serous otitis on right. Treat with augmentin course. rec mucinex D and ibuprofen with meals. Update if not improving as expected. Pt agrees with plan.

## 2014-03-30 NOTE — Progress Notes (Signed)
BP 110/82 mmHg  Pulse 93  Temp(Src) 98.4 F (36.9 C) (Oral)  Ht 5\' 2"  (1.575 m)  Wt 196 lb (88.905 kg)  BMI 35.84 kg/m2  LMP 03/25/2014   CC: congestion, cough  Subjective:    Patient ID: Julie Vazquez, female    DOB: 1965-08-16, 48 y.o.   MRN: [id number]  HPI: Julie Vazquez is a 48 y.o. female presenting on 03/30/2014 for Cough; Sinusitis; Ear Pain; and Fever   5 d h/o nasal sinus congestion, felt feverish, minimal cough, sore throat, R ear pain. Some muffled hearing. PNdrainage leading to nausea and cough. + R tooth pain  Self treated with mucinex and flonase  Has continued working this week. No sick contacts at home or at work.  Increasing allergic rhinitis. Zyrtec helps. claritin and allegra doesn't help much.   Relevant past medical, surgical, family and social history reviewed and updated as indicated.  Allergies and medications reviewed and updated. Current Outpatient Prescriptions on File Prior to Visit  Medication Sig  . cholecalciferol (VITAMIN D) 1000 UNITS tablet Take 1,000 Units by mouth daily.  . Multiple Vitamins-Minerals (MULTIVITAMIN PO) Take by mouth.    Marland Kitchen omeprazole (PRILOSEC) 40 MG capsule Take one twice daily   No current facility-administered medications on file prior to visit.    Review of Systems Per HPI unless specifically indicated above    Objective:    BP 110/82 mmHg  Pulse 93  Temp(Src) 98.4 F (36.9 C) (Oral)  Ht 5\' 2"  (1.575 m)  Wt 196 lb (88.905 kg)  BMI 35.84 kg/m2  LMP 03/25/2014  Physical Exam  Constitutional: She appears well-developed and well-nourished. No distress.  HENT:  Head: Normocephalic and atraumatic.  Right Ear: Hearing, external ear and ear canal normal. Tympanic membrane is erythematous and retracted.  Left Ear: Hearing, tympanic membrane, external ear and ear canal normal.  Nose: Mucosal edema present. No rhinorrhea. Right sinus exhibits maxillary sinus tenderness. Right sinus exhibits no frontal sinus  tenderness. Left sinus exhibits no maxillary sinus tenderness and no frontal sinus tenderness.  Mouth/Throat: Uvula is midline, oropharynx is clear and moist and mucous membranes are normal. No oropharyngeal exudate, posterior oropharyngeal edema, posterior oropharyngeal erythema or tonsillar abscesses.  R TM retracted and erythematous, tender to exam, serous fluid behind ear.  Eyes: Conjunctivae and EOM are normal. Pupils are equal, round, and reactive to light. No scleral icterus.  Neck: Normal range of motion. Neck supple.  Cardiovascular: Normal rate, regular rhythm, normal heart sounds and intact distal pulses.   No murmur heard. Pulmonary/Chest: Effort normal and breath sounds normal. No respiratory distress. She has no wheezes. She has no rales.  Lymphadenopathy:    She has no cervical adenopathy.  Skin: Skin is warm and dry. No rash noted.  Nursing note and vitals reviewed.      Assessment & Plan:   Problem List Items Addressed This Visit    Acute maxillary sinusitis - Primary    Anticipate acute maxillary sinusitis with serous otitis on right. Treat with augmentin course. rec mucinex D and ibuprofen with meals. Update if not improving as expected. Pt agrees with plan.    Relevant Medications      amoxicillin-clavulanate (AUGMENTIN) tablet 875-125 mg    Other Visit Diagnoses    Right acute serous otitis media, recurrence not specified        Relevant Medications       amoxicillin-clavulanate (AUGMENTIN) tablet 875-125 mg        Follow up plan: Return  if symptoms worsen or fail to improve.

## 2014-03-30 NOTE — Patient Instructions (Signed)
Para sinusitis - tome augmentina por 10 das. puede tomar mucinex D con mucha agua y puede tomar ibuprofeno 400-600mg  dos veces al dia con comida. Mucha agua y mucho descanso. llamenos si no mejora con esto.

## 2014-11-03 ENCOUNTER — Encounter: Payer: Self-pay | Admitting: Family Medicine

## 2014-11-03 ENCOUNTER — Ambulatory Visit (INDEPENDENT_AMBULATORY_CARE_PROVIDER_SITE_OTHER): Payer: 59 | Admitting: Family Medicine

## 2014-11-03 VITALS — BP 110/78 | HR 87 | Temp 98.1°F | Wt 205.2 lb

## 2014-11-03 DIAGNOSIS — D179 Benign lipomatous neoplasm, unspecified: Secondary | ICD-10-CM | POA: Diagnosis not present

## 2014-11-03 DIAGNOSIS — K219 Gastro-esophageal reflux disease without esophagitis: Secondary | ICD-10-CM

## 2014-11-03 DIAGNOSIS — E669 Obesity, unspecified: Secondary | ICD-10-CM

## 2014-11-03 NOTE — Assessment & Plan Note (Signed)
Weight gain noted. Discussed healthy diet and lifestyle changes to affect sustainable weight loss. 2 wks ago pt started slim fast shakes.

## 2014-11-03 NOTE — Patient Instructions (Signed)
Pass by Marion's office to schedule referral to general surgery for discussion on lipoma options.

## 2014-11-03 NOTE — Assessment & Plan Note (Signed)
4-5cm in size, per pt enlarging and tender. Requests referral to surgery in [place]. Placed.

## 2014-11-03 NOTE — Progress Notes (Signed)
Pre visit review using our clinic review tool, if applicable. No additional management support is needed unless otherwise documented below in the visit note. 

## 2014-11-03 NOTE — Progress Notes (Addendum)
   BP 110/78 mmHg  Pulse 87  Temp(Src) 98.1 F (36.7 C) (Oral)  Wt 205 lb 4 oz (93.101 kg)  SpO2 98%  LMP 10/24/2014   CC: check lipoma  Subjective:    Patient ID: Julie Vazquez, female    DOB: June 11, 1965, 49 y.o.   MRN: [id number]  HPI: Earnestine Shipp is a 49 y.o. female presenting on 11/03/2014 for Back Issues   Mass on back present for years, now enlarging and becoming painful.   No redness or warmth.   Relevant past medical, surgical, family and social history reviewed and updated as indicated. Interim medical history since our last visit reviewed. Allergies and medications reviewed and updated. Current Outpatient Prescriptions on File Prior to Visit  Medication Sig  . cholecalciferol (VITAMIN D) 1000 UNITS tablet Take 1,000 Units by mouth daily.  . Multiple Vitamins-Minerals (MULTIVITAMIN PO) Take by mouth.    Marland Kitchen omeprazole (PRILOSEC) 40 MG capsule Take one twice daily (Patient taking differently: Take 40 mg by mouth daily as needed. )   No current facility-administered medications on file prior to visit.    Review of Systems Per HPI unless specifically indicated above     Objective:    BP 110/78 mmHg  Pulse 87  Temp(Src) 98.1 F (36.7 C) (Oral)  Wt 205 lb 4 oz (93.101 kg)  SpO2 98%  LMP 10/24/2014  Wt Readings from Last 3 Encounters:  11/03/14 205 lb 4 oz (93.101 kg)  03/30/14 196 lb (88.905 kg)  02/10/14 199 lb 12 oz (90.606 kg)   Body mass index is 37.53 kg/(m^2).  Physical Exam  Constitutional: She appears well-developed and well-nourished. No distress.  Skin: Skin is warm and dry. No rash noted.  5cm well circumscribed fatty tumor left upper back overlying scapula  Nursing note and vitals reviewed.      Assessment & Plan:   Problem List Items Addressed This Visit    GERD (gastroesophageal reflux disease)    PPI omeprazole 40mg  PRN helpful but pt worried may be causing joint/bone aches. Will let us know if desires to try different PPI. Has chnaged  diet.      Relevant Medications   Probiotic Product (PROBIOTIC DAILY PO)   Lipoma - Primary    4-5cm in size, per pt enlarging and tender. Requests referral to surgery in [place]. Placed.      Relevant Orders   Ambulatory referral to General Surgery   Obesity, Class II, BMI 35-39.9, no comorbidity    Weight gain noted. Discussed healthy diet and lifestyle changes to affect sustainable weight loss. 2 wks ago pt started slim fast shakes.           Follow up plan: Return as needed.

## 2014-11-03 NOTE — Assessment & Plan Note (Signed)
PPI omeprazole 40mg  PRN helpful but pt worried may be causing joint/bone aches. Will let us know if desires to try different PPI. Has chnaged diet.

## 2014-11-06 ENCOUNTER — Encounter: Payer: Self-pay | Admitting: *Deleted

## 2014-11-16 ENCOUNTER — Ambulatory Visit: Payer: Self-pay | Admitting: General Surgery

## 2014-11-28 ENCOUNTER — Ambulatory Visit (INDEPENDENT_AMBULATORY_CARE_PROVIDER_SITE_OTHER): Payer: 59 | Admitting: General Surgery

## 2014-11-28 ENCOUNTER — Encounter: Payer: Self-pay | Admitting: General Surgery

## 2014-11-28 VITALS — BP 146/84 | HR 86 | Resp 14 | Ht 61.0 in | Wt 207.0 lb

## 2014-11-28 DIAGNOSIS — R229 Localized swelling, mass and lump, unspecified: Secondary | ICD-10-CM | POA: Diagnosis not present

## 2014-11-28 DIAGNOSIS — R222 Localized swelling, mass and lump, trunk: Secondary | ICD-10-CM

## 2014-11-28 NOTE — Progress Notes (Signed)
Patient ID: Julie Vazquez, female   DOB: Sep 11, 1965, 49 y.o.   MRN: [id number]  Chief Complaint  Patient presents with  . Other    lipoma on back    HPI Julie Vazquez is a 49 y.o. female here today for a evaluation of a lipoma on back. Patien t states the lipoma has been there for 5 years. She states the area has got bigger. No pain but some itching at the site has been noted.  HPI  Past Medical History  Diagnosis Date  . History of peptic ulcer disease     remote  . Seasonal allergies   . 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 SUI (stress urinary incontinence, female)     ? told has this in past.  . Rectal mass 08/2010    colonoscopy - submucosal bulge but no lesion (?from retroverted uterus) (Outlaw)  . Vitamin D deficiency   . GERD (gastroesophageal reflux disease)     Past Surgical History  Procedure Laterality Date  . Cesarean section    . Middle ear surgery  2000s  . Colonoscopy  09/05/2010    nodular prominence anterior, mobile, likely compression from retoverted uterus    Family History  Problem Relation Age of Onset  . Hypertension Mother   . Diabetes Mother   . Cancer Maternal Aunt     ovarian  . Miscarriages / Stillbirths Maternal Uncle   . Diabetes Maternal Grandmother   . Coronary artery disease Maternal Grandfather     MI  . Cancer Cousin     breast  . Stroke Maternal Uncle   . Stroke Brother   . Cancer Sister     breast    Social History History  Substance Use Topics  . Smoking status: Never Smoker   . Smokeless tobacco: Never Used  . Alcohol Use: 0.0 oz/week    0 Standard drinks or equivalent per week     Comment: Social    No Known Allergies  Current Outpatient Prescriptions  Medication Sig Dispense Refill  . cholecalciferol (VITAMIN D) 1000 UNITS tablet Take 1,000 Units by mouth daily.    Marland Kitchen omeprazole (PRILOSEC) 40 MG capsule Take one twice daily (Patient taking differently: Take 40 mg by mouth  daily as needed. ) 60 capsule 3  . Multiple Vitamins-Minerals (MULTIVITAMIN PO) Take by mouth.      . Probiotic Product (PROBIOTIC DAILY PO) Take 1 tablet by mouth daily.     No current facility-administered medications for this visit.    Review of Systems Review of Systems  Constitutional: Negative.   Respiratory: Negative.   Cardiovascular: Negative.     Blood pressure 146/84, pulse 86, resp. rate 14, height 5\' 1"  (1.549 m), weight 207 lb (93.895 kg), last menstrual period 10/24/2014.  Physical Exam Physical Exam  Constitutional: She is oriented to person, place, and time. She appears well-developed and well-nourished.  Eyes: Conjunctivae are normal. No scleral icterus.  Neck: Neck supple.  Cardiovascular: Normal rate, regular rhythm and normal heart sounds.   Pulmonary/Chest: Effort normal and breath sounds normal.  Lymphadenopathy:    She has no cervical adenopathy.  Neurological: She is alert and oriented to person, place, and time.  Skin: Skin is warm and dry.          Assessment    Lipoma, left posterior shoulder.    Plan    The pros and cons of elective excision were reviewed. As  this area has been steadily increasing in size it is elected to proceed to excision. 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    Patient to return for excision back mass PCP:  Selmer Dominion 11/28/2014, 7:54 PM

## 2014-11-30 ENCOUNTER — Ambulatory Visit (INDEPENDENT_AMBULATORY_CARE_PROVIDER_SITE_OTHER): Payer: 59 | Admitting: General Surgery

## 2014-11-30 ENCOUNTER — Encounter: Payer: Self-pay | Admitting: General Surgery

## 2014-11-30 VITALS — BP 130/72 | HR 90 | Resp 12 | Ht 61.0 in | Wt 206.0 lb

## 2014-11-30 DIAGNOSIS — R229 Localized swelling, mass and lump, unspecified: Secondary | ICD-10-CM

## 2014-11-30 DIAGNOSIS — R222 Localized swelling, mass and lump, trunk: Secondary | ICD-10-CM

## 2014-11-30 NOTE — Progress Notes (Signed)
Patient ID: Julie Vazquez, female   DOB: 07/03/1965, 49 y.o.   MRN: [id number]  Chief Complaint  Patient presents with  . Procedure    excision back mass    HPI Julie Vazquez is a 49 y.o. female here today for excision back mass.  HPI  Past Medical History  Diagnosis Date  . History of peptic ulcer disease     remote  . Seasonal allergies   . 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 SUI (stress urinary incontinence, female)     ? told has this in past.  . Rectal mass 08/2010    colonoscopy - submucosal bulge but no lesion (?from retroverted uterus) (Outlaw)  . Vitamin D deficiency   . GERD (gastroesophageal reflux disease)     Past Surgical History  Procedure Laterality Date  . Cesarean section    . Middle ear surgery  2000s  . Colonoscopy  09/05/2010    nodular prominence anterior, mobile, likely compression from retoverted uterus    Family History  Problem Relation Age of Onset  . Hypertension Mother   . Diabetes Mother   . Cancer Maternal Aunt     ovarian  . Miscarriages / Stillbirths Maternal Uncle   . Diabetes Maternal Grandmother   . Coronary artery disease Maternal Grandfather     MI  . Cancer Cousin     breast  . Stroke Maternal Uncle   . Stroke Brother   . Cancer Sister     breast    Social History History  Substance Use Topics  . Smoking status: Never Smoker   . Smokeless tobacco: Never Used  . Alcohol Use: 0.0 oz/week    0 Standard drinks or equivalent per week     Comment: Social    No Known Allergies  Current Outpatient Prescriptions  Medication Sig Dispense Refill  . cholecalciferol (VITAMIN D) 1000 UNITS tablet Take 1,000 Units by mouth daily.    . Multiple Vitamins-Minerals (MULTIVITAMIN PO) Take by mouth.      Marland Kitchen omeprazole (PRILOSEC) 40 MG capsule Take one twice daily (Patient taking differently: Take 40 mg by mouth daily as needed. ) 60 capsule 3  . Probiotic Product (PROBIOTIC DAILY PO) Take 1  tablet by mouth daily.     No current facility-administered medications for this visit.    Review of Systems Review of Systems  Constitutional: Negative.   Respiratory: Negative.   Cardiovascular: Negative.     Blood pressure 130/72, pulse 90, resp. rate 12, height 5\' 1"  (1.549 m), weight 206 lb (93.441 kg), last menstrual period 10/16/2014.  Physical Exam Physical Exam Examination again shows a well-defined mass on the left posterior shoulder area.      Assessment    Left posterior shoulder lipoma.    Plan    The area was cleansed with alcohol and 20 mL of 0.5% Xylocaine with 0.25% Marcaine with 1-200,000 units of epinephrine was utilized well tolerated. Chlor prep was applied to the skin. A transverse incision was made over the mass. Sharp dissection was used to remove the lipoma. This did not extend to the deep fascia. No bleeding was noted. The wound was approximated in layers with interrupted 3-0 Vicryls sutures to the adipose tissue and a running 3-0 Vicryls suture for the skin. Benzoin Steri-Strips applied. Telfa and Tegaderm dressing applied.  Postoperative instructions reviewed. In one week for wound evaluation with the nurse.      PCP:  Ria Bush   [facility] 12/02/2014, 8:24 AM

## 2014-11-30 NOTE — Patient Instructions (Addendum)
Keep area clean  One week nurse

## 2014-12-06 ENCOUNTER — Ambulatory Visit (INDEPENDENT_AMBULATORY_CARE_PROVIDER_SITE_OTHER): Payer: 59 | Admitting: *Deleted

## 2014-12-06 DIAGNOSIS — R229 Localized swelling, mass and lump, unspecified: Secondary | ICD-10-CM

## 2014-12-06 DIAGNOSIS — R222 Localized swelling, mass and lump, trunk: Secondary | ICD-10-CM

## 2014-12-06 NOTE — Progress Notes (Signed)
Patient came in today for a wound check/excision left back mass.  The wound is clean, with no signs of infection noted.Aware of pathology. Follow up as scheduled.

## 2014-12-06 NOTE — Patient Instructions (Signed)
The patient is aware to call back for any questions or concerns.  

## 2015-02-05 ENCOUNTER — Encounter: Payer: Self-pay | Admitting: General Surgery

## 2015-02-05 ENCOUNTER — Ambulatory Visit (INDEPENDENT_AMBULATORY_CARE_PROVIDER_SITE_OTHER): Payer: 59 | Admitting: General Surgery

## 2015-02-05 VITALS — BP 124/76 | HR 88 | Resp 14 | Ht 61.0 in | Wt 203.0 lb

## 2015-02-05 DIAGNOSIS — L910 Hypertrophic scar: Secondary | ICD-10-CM

## 2015-02-05 NOTE — Progress Notes (Signed)
Patient ID: Julie Vazquez, female   DOB: 18-Apr-1966, 49 y.o.   MRN: [id number]  Chief Complaint  Patient presents with  . Follow-up    keloid scar    HPI Julie Vazquez is a 49 y.o. female here today for a follow up from a lipoma excision from her back done 11/2014. She reports it is itchy and tender to the touch. She states this started about a month ago.  HPI  Past Medical History  Diagnosis Date  . History of peptic ulcer disease     remote  . Seasonal allergies   . 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 SUI (stress urinary incontinence, female)     ? told has this in past.  . Rectal mass 08/2010    colonoscopy - submucosal bulge but no lesion (?from retroverted uterus) (Outlaw)  . Vitamin D deficiency   . GERD (gastroesophageal reflux disease)     Past Surgical History  Procedure Laterality Date  . Cesarean section    . Middle ear surgery  2000s  . Colonoscopy  09/05/2010    nodular prominence anterior, mobile, likely compression from retoverted uterus    Family History  Problem Relation Age of Onset  . Hypertension Mother   . Diabetes Mother   . Cancer Maternal Aunt     ovarian  . Miscarriages / Stillbirths Maternal Uncle   . Diabetes Maternal Grandmother   . Coronary artery disease Maternal Grandfather     MI  . Cancer Cousin     breast  . Stroke Maternal Uncle   . Stroke Brother   . Cancer Sister     breast    Social History Social History  Substance Use Topics  . Smoking status: Never Smoker   . Smokeless tobacco: Never Used  . Alcohol Use: 0.0 oz/week    0 Standard drinks or equivalent per week     Comment: Social    No Known Allergies  Current Outpatient Prescriptions  Medication Sig Dispense Refill  . cholecalciferol (VITAMIN D) 1000 UNITS tablet Take 1,000 Units by mouth daily.    . Multiple Vitamins-Minerals (MULTIVITAMIN PO) Take by mouth.      Marland Kitchen omeprazole (PRILOSEC) 40 MG capsule Take one twice  daily (Patient taking differently: Take 40 mg by mouth daily as needed. ) 60 capsule 3  . Probiotic Product (PROBIOTIC DAILY PO) Take 1 tablet by mouth daily.     No current facility-administered medications for this visit.    Review of Systems Review of Systems  Constitutional: Negative.   Respiratory: Negative.   Cardiovascular: Negative.     Blood pressure 124/76, pulse 88, resp. rate 14, height 5\' 1"  (1.549 m), weight 203 lb (92.08 kg).  Physical Exam Physical Exam  Constitutional: She is oriented to person, place, and time. She appears well-developed and well-nourished.  Pulmonary/Chest:    Neurological: She is alert and oriented to person, place, and time.  Skin: Skin is warm and dry.     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    Plan    The role of injectable dexamethasone to minimize ongoing progression was reviewed. 4 mg of dexamethasone was mixed with 1 mL of 1% Xylocaine with 1-100,000 epinephrine. An injected in the epidermal layer. The procedure was well tolerated. 1 mL of 1% plain Xylocaine was used at the initiation to minimize discomfort.  Gentle massage to the area daily  has been encouraged.    Patient to return in one month. If the area has sufficiently flattened and the itching has resolved she can cancel this appointment. PCP: Selmer Dominion 02/05/2015, 5:01 PM

## 2015-02-05 NOTE — Patient Instructions (Addendum)
Patient to return in one month. 

## 2015-03-07 ENCOUNTER — Ambulatory Visit (INDEPENDENT_AMBULATORY_CARE_PROVIDER_SITE_OTHER): Payer: 59 | Admitting: Primary Care

## 2015-03-07 ENCOUNTER — Encounter: Payer: Self-pay | Admitting: Primary Care

## 2015-03-07 VITALS — BP 124/78 | HR 100 | Temp 97.7°F | Ht 61.0 in | Wt 204.4 lb

## 2015-03-07 DIAGNOSIS — R059 Cough, unspecified: Secondary | ICD-10-CM

## 2015-03-07 DIAGNOSIS — R05 Cough: Secondary | ICD-10-CM | POA: Diagnosis not present

## 2015-03-07 MED ORDER — BENZONATATE 200 MG PO CAPS: 200.0000 mg | ORAL_CAPSULE | Freq: Three times a day (TID) | ORAL | Status: DC | PRN

## 2015-03-07 NOTE — Progress Notes (Signed)
Subjective:    Patient ID: Julie Vazquez, female    DOB: 19-Nov-1965, 49 y.o.   MRN: [id number]  HPI  Julie Vazquez is a 49 year old female who presents today with a chief complaint of cough, nasal congestion,right ear pain, and headache. Her cough is productive with clear sputum and has been present since last night. She also experienced fever last night. Her other symptoms have been present since Sunday. She's taken tylenol sinus medication with Mucinex and Nasacort with temporary relief. Her most bothersome symptom is cough. Denies fevers today.  Review of Systems  Constitutional: Positive for fever.  HENT: Positive for congestion, ear pain, postnasal drip, sinus pressure and sneezing.   Respiratory: Positive for cough. Negative for shortness of breath.   Cardiovascular: Negative for chest pain.  Gastrointestinal: Positive for nausea.       Past Medical History  Diagnosis Date  . History of peptic ulcer disease     remote  . Seasonal allergies   . 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 SUI (stress urinary incontinence, female)     ? told has this in past.  . Rectal mass 08/2010    colonoscopy - submucosal bulge but no lesion (?from retroverted uterus) ([place])  . Vitamin D deficiency   . GERD (gastroesophageal reflux disease)     Social History   Social History  . Marital Status: Married    Spouse Name: N/A  . Number of Children: N/A  . Years of Education: N/A   Occupational History  . Not on file.   Social History Main Topics  . Smoking status: Never Smoker   . Smokeless tobacco: Never Used  . Alcohol Use: 0.0 oz/week    0 Standard drinks or equivalent per week     Comment: Social  . Drug Use: No  . Sexual Activity: Not on file   Other Topics Concern  . Not on file   Social History Narrative   Dominican   Caffeine: 1-2 cups coffee/day   Lives with husband and 2 children   Occupation: Copywriter, advertising   Activity:  wants to start going to gym   Diet: bread, fruits/vegetables daily, good water, red meat occasional, 2x/wk fish    Past Surgical History  Procedure Laterality Date  . Cesarean section    . Middle ear surgery  2000s  . Colonoscopy  09/05/2010    nodular prominence anterior, mobile, likely compression from retoverted uterus    Family History  Problem Relation Age of Onset  . Hypertension Mother   . Diabetes Mother   . Cancer Maternal Aunt     ovarian  . Miscarriages / Stillbirths Maternal Uncle   . Diabetes Maternal Grandmother   . Coronary artery disease Maternal Grandfather     MI  . Cancer Cousin     breast  . Stroke Maternal Uncle   . Stroke Brother   . Cancer Sister     breast    No Known Allergies  Current Outpatient Prescriptions on File Prior to Visit  Medication Sig Dispense Refill  . cholecalciferol (VITAMIN D) 1000 UNITS tablet Take 1,000 Units by mouth daily.    . Multiple Vitamins-Minerals (MULTIVITAMIN PO) Take by mouth.      Marland Kitchen omeprazole (PRILOSEC) 40 MG capsule Take one twice daily (Patient taking differently: Take 40 mg by mouth daily as needed. ) 60 capsule 3  . Probiotic Product (PROBIOTIC DAILY PO) Take  1 tablet by mouth daily.     No current facility-administered medications on file prior to visit.    BP 124/78 mmHg  Pulse 100  Temp(Src) 97.7 F (36.5 C) (Oral)  Ht 5\' 1"  (1.549 m)  Wt 204 lb 6.4 oz (92.715 kg)  BMI 38.64 kg/m2  SpO2 98%  LMP 02/14/2015    Objective:   Physical Exam  Constitutional: She appears well-nourished.  HENT:  Right Ear: Tympanic membrane and ear canal normal.  Left Ear: Tympanic membrane and ear canal normal.  Nose: Right sinus exhibits maxillary sinus tenderness. Right sinus exhibits no frontal sinus tenderness. Left sinus exhibits maxillary sinus tenderness. Left sinus exhibits no frontal sinus tenderness.  Mouth/Throat: Oropharynx is clear and moist.  Cardiovascular: Normal rate and regular rhythm.     Pulmonary/Chest: Effort normal and breath sounds normal. She has no wheezes. She has no rales.  Skin: Skin is warm and dry.          Assessment & Plan:  Viral URI:  Nasal congestion, sinus pressure, low grade fevers since Sunday. Cough since last night. No fevers today. Lungs clear, mild maxillary sinus tenderness, overall normal exam. Suspect viral process at this point and will treat with supportive measures. RX for Tessalon pearls for cough. May continue tylenol cold and sinus and Nasacort OTC. Work note provided to allow her to rest at home. She is to call us Monday if no improvement.

## 2015-03-07 NOTE — Progress Notes (Signed)
Pre visit review using our clinic review tool, if applicable. No additional management support is needed unless otherwise documented below in the visit note. 

## 2015-03-07 NOTE — Patient Instructions (Signed)
You may take the Benzonatate capsules for cough. Take 1 capsule by mouth as needed for cough.  You may continue using Nasacort and tylenol sinus medication.  Increase consumption of fluids and ensure to rest today. Please call if no improvement next Monday.  It was a pleasure meeting you!  Infeccin del tracto respiratorio superior, adultos (Upper Respiratory Infection, Adult) La mayora de las infecciones del tracto respiratorio superior son infecciones virales de las vas que llevan el aire a los pulmones. Un infeccin del tracto respiratorio superior afecta la nariz, la garganta y las vas respiratorias superiores. El tipo ms frecuente de infeccin del tracto respiratorio superior es la nasofaringitis, que habitualmente se conoce como "resfro comn". Las infecciones del tracto respiratorio superior siguen su curso y por lo general se curan solas. En la [employer], la infeccin del tracto respiratorio superior no requiere atencin [place], Armed forces training and education officer a veces, despus de una infeccin viral, puede surgir una infeccin bacteriana en las vas respiratorias superiores. Esto se conoce como infeccin secundaria. Las infecciones sinusales y en el odo medio son tipos frecuentes de infecciones secundarias en el tracto respiratorio superior. La neumona bacteriana tambin puede complicar un cuadro de infeccin del tracto respiratorio superior. Este tipo de infeccin puede empeorar el asma y la enfermedad pulmonar obstructiva crnica (EPOC). En algunos casos, estas complicaciones pueden requerir atencin mdica de emergencia y poner en peligro la vida.  CAUSAS Casi todas las infecciones del tracto respiratorio superior se deben a los virus. Un virus es un tipo de microbio que puede contagiarse de Ardelia Mems persona a Theatre manager.  FACTORES DE RIESGO Puede estar en riesgo de sufrir una infeccin del tracto respiratorio superior si:   [place]uma.  Tiene una enfermedad pulmonar o cardaca crnica.  Tiene debilitado el  sistema de defensa (inmunitario) del cuerpo.  Es muy joven o de edad muy [place].  Tiene asma o alergias nasales.  Trabaja en reas donde hay mucha gente o poca ventilacin.  Governor Rooks en una escuela o en un centro de atencin mdica. SIGNOS Y SNTOMAS  Habitualmente, los sntomas aparecen de 2a 3das despus de entrar en contacto con el virus del resfro. La mayora de las infecciones virales en el tracto respiratorio superior duran de 7a 10das. Sin embargo, las infecciones virales en el tracto respiratorio superior a causa del virus de la gripe pueden durar de 14a 18das y, habitualmente, son ms graves. Entre los sntomas se pueden incluir los siguientes:   Secrecin o congestin nasal.  Estornudos.  Tos.  Dolor de Investment banker, operational.  Dolor de [place].  Fatiga.  Cristy Hilts.  Prdida del apetito.  Dolor en la frente, detrs de los ojos y por encima de los pmulos (dolor sinusal).  Dolores musculares. DIAGNSTICO  El mdico puede diagnosticar una infeccin del tracto respiratorio superior mediante los siguientes estudios:  Examen fsico.  Pruebas para verificar si los sntomas no se deben a otra afeccin, por ejemplo:  Faringitis estreptoccica.  Sinusitis.  Neumona.  Asma. TRATAMIENTO  Esta infeccin desaparece sola, con el tiempo. No puede curarse con medicamentos, pero a menudo se prescriben para aliviar los sntomas. Los medicamentos pueden ser tiles para lo siguiente:  Engineer, materials fiebre.  Reducir la tos.  Aliviar la congestin nasal. INSTRUCCIONES PARA EL CUIDADO EN EL HOGAR   Tome los medicamentos solamente como se lo haya indicado el mdico.  A fin de Best boy de garganta, haga grgaras con solucin salina templada o consuma caramelos para la tos, como se lo haya indicado el mdico.  Use un humidificador de vapor clido o inhale el vapor de la ducha para aumentar la humedad del aire. Esto facilitar la respiracin.  Beba suficiente lquido para  Consulting civil engineer orina clara o de color amarillo plido.  Consuma sopas y otros caldos transparentes, y [employer].  Descanse todo lo que sea necesario.  Regrese al [place] cuando la temperatura se le haya normalizado o cuando el mdico lo autorice. Es posible que deba quedarse en su casa durante un tiempo prolongado, para no infectar a los dems. [place] usar un barbijo y lavarse las manos con cuidado para Mining engineer propagacin del virus.  Aumente el uso del inhalador si tiene asma.  No consuma ningn producto que contenga tabaco, lo que incluye cigarrillos, tabaco de Higher education careers adviser o Psychologist, sport and exercise. Si necesita ayuda para dejar de fumar, consulte al [employer]. PREVENCIN  La mejor manera de protegerse de un resfro es mantener una higiene [place].   Evite el contacto oral o fsico con personas que tengan sntomas de resfro.  En caso de contacto, lvese las manos con frecuencia. No hay pruebas claras de que la vitaminaC, la vitaminaE, la equincea o el ejercicio reduzcan la probabilidad de Museum/gallery curator un resfro. Sin embargo, siempre se recomienda Scientific laboratory technician, hacer ejercicio y Ecologist.  SOLICITE ATENCIN MDICA SI:   Su estado empeora en lugar de mejorar.  Los medicamentos no Animator.  Tiene escalofros.  La sensacin de falta de aire empeora.  Tiene mucosidad marrn o roja.  Tiene secrecin nasal amarilla o marrn.  Le duele la cara, especialmente al inclinarse hacia adelante.  Tiene fiebre.  Tiene los ganglios del cuello hinchados.  Siente dolor al tragar.  Tiene zonas blancas en la parte de atrs de la garganta. SOLICITE ATENCIN MDICA DE INMEDIATO SI:   Tiene sntomas intensos o persistentes de:  Dolor de [place].  Dolor de odos.  Dolor sinusal.  Dolor en el pecho.  Tiene enfermedad pulmonar crnica y cualquiera de estos sntomas:  Sibilancias.  Tos prolongada.  Tos con sangre.  Cambio en la mucosidad  habitual.  Presenta rigidez en el cuello.  Tiene cambios en:  La visin.  La audicin.  El pensamiento.  El [place] de nimo. ASEGRESE DE QUE:   Comprende estas instrucciones.  Controlar su afeccin.  Recibir ayuda de inmediato si no mejora o si empeora.   Esta informacin no tiene Marine scientist el consejo del mdico. Asegrese de hacerle al mdico cualquier pregunta que tenga.   Document Released: 02/05/2005 Document Revised: 09/12/2014 Elsevier Interactive Patient Education [employer].

## 2015-03-08 ENCOUNTER — Telehealth: Payer: Self-pay | Admitting: Family Medicine

## 2015-03-08 MED ORDER — GUAIFENESIN-CODEINE 100-10 MG/5ML PO SYRP: 5.0000 mL | ORAL_SOLUTION | Freq: Every evening | ORAL | Status: DC | PRN

## 2015-03-08 NOTE — Telephone Encounter (Signed)
Rx called in as directed and message left notifying patient. 

## 2015-03-08 NOTE — Telephone Encounter (Signed)
Patient Name: Julie Vazquez DOB: [birth year] Initial Comment Caller states she was seen yesterday- DX- Cold. She was given Benzonatate, she now has red bumps all over her body, and eyes are swelling. She's not feeling good, feels weird. Nurse Assessment Nurse: Vallery Sa, RN, Cathy Date/Time (Eastern Time): 03/08/2015 9:39:45 AM Confirm and document reason for call. If symptomatic, describe symptoms. ---No severe breathing or swallowing difficulty. Alert and responsive. Caller states she developed rash yesterday that is still on her chest. Eyes not swollen today. No known fever. Has the patient traveled out of the country within the last 30 days? ---No Does the patient have any new or worsening symptoms? ---Yes Will a triage be completed? ---Yes Related visit to physician within the last 2 weeks? ---Yes Does the PT have any chronic conditions? (i.e. diabetes, asthma, etc.) ---Yes List chronic conditions. ---Acid-Reflux, Took Benzonate for cough yesterday and thinks she had a reaction to it and hasn't taken it again (cough not worse) Guidelines Guideline Title Affirmed Question Affirmed Notes Rash - Widespread On Drugs Face or lip swelling eye swelling Final Disposition User See Physician within 4 Hours (or PCP triage) Vallery Sa, RN, Cathy Comments Caller declined the See Within 4 Hours disposition. Per profile directive: If patient refuses any other outcome/does not want appointment within recommended time frame: o Tell the caller that you will make a note in the chart and have someone call them back (DO NOT give them a time frame for the call back). o Make a note in chart that patient refused outcome and requests a call back. Raquel James notified. Referrals GO TO FACILITY REFUSED Disagree/Comply: Disagree

## 2015-03-08 NOTE — Telephone Encounter (Signed)
Patient notified and would like cough syrup Rx. Advised to wait until after 3PM to come and pick up Rx.

## 2015-03-08 NOTE — Telephone Encounter (Signed)
Agree with stopping tessalon perls. Added to allergy list.  Would she like to try codeine cough syrup for night time?  If worsening rash or swelling rec eval in office.

## 2015-03-08 NOTE — Telephone Encounter (Signed)
plz phone in cheratussin - in chart.

## 2015-03-12 ENCOUNTER — Ambulatory Visit: Payer: 59 | Admitting: General Surgery

## 2015-05-02 ENCOUNTER — Encounter: Payer: Self-pay | Admitting: *Deleted

## 2015-05-30 ENCOUNTER — Telehealth: Payer: Self-pay | Admitting: Internal Medicine

## 2015-05-30 NOTE — Telephone Encounter (Signed)
Received referral from Physician for [facility] for screening colonoscopy. Last colon with [facility] in 2012. Records placed on Dr. Vena Rua desk for review.

## 2015-05-31 NOTE — Telephone Encounter (Signed)
Dr. Hilarie Fredrickson reviewed records and has accepted patient. Ok to schedule Direct Colon if patient has a family hx of colorectal cancer or personal hx of high risk polyps. If no to either, then it will be ok for patient to wait until April/2022 for next colon.   I spoke to patient and she denies any family hx of colorectal cancer and denies personal hx of high risk polyps. Recall colon entered for April/2022

## 2015-07-11 LAB — HM MAMMOGRAPHY: HM Mammogram: NORMAL (ref 0–4)

## 2015-10-05 ENCOUNTER — Ambulatory Visit (INDEPENDENT_AMBULATORY_CARE_PROVIDER_SITE_OTHER): Payer: BLUE CROSS/BLUE SHIELD | Admitting: Family Medicine

## 2015-10-05 ENCOUNTER — Encounter: Payer: Self-pay | Admitting: Family Medicine

## 2015-10-05 VITALS — BP 118/86 | HR 98 | Temp 97.9°F | Wt 199.5 lb

## 2015-10-05 DIAGNOSIS — R059 Cough, unspecified: Secondary | ICD-10-CM

## 2015-10-05 DIAGNOSIS — R05 Cough: Secondary | ICD-10-CM | POA: Diagnosis not present

## 2015-10-05 DIAGNOSIS — L910 Hypertrophic scar: Secondary | ICD-10-CM | POA: Diagnosis not present

## 2015-10-05 MED ORDER — MONTELUKAST SODIUM 10 MG PO TABS: 10.0000 mg | ORAL_TABLET | Freq: Every day | ORAL | Status: DC

## 2015-10-05 MED ORDER — TRIAMCINOLONE ACETONIDE 0.1 % EX CREA: 1.0000 "application " | TOPICAL_CREAM | Freq: Two times a day (BID) | CUTANEOUS | Status: DC

## 2015-10-05 MED ORDER — OMEPRAZOLE 40 MG PO CPDR: 40.0000 mg | DELAYED_RELEASE_CAPSULE | Freq: Two times a day (BID) | ORAL | Status: DC

## 2015-10-05 NOTE — Patient Instructions (Addendum)
Siga flonase y claritina para alergia.  Creo que la tos es tos Herbalist. Trate singulair de receta diaria cada noche.   Rinitis alrgica (Allergic Rhinitis) La rinitis alrgica ocurre cuando las membranas mucosas de la nariz responden a los alrgenos. Los alrgenos son las partculas que estn en el aire y que hacen que el cuerpo tenga una reaccin IT consultant. Esto hace que usted libere anticuerpos alrgicos. A travs de una cadena de eventos, estos finalmente hacen que usted libere histamina en la corriente sangunea. Aunque la funcin de la histamina es proteger al organismo, es esta liberacin de histamina lo que provoca malestar, como los estornudos frecuentes, la congestin y goteo y Engineer, petroleum.  CAUSAS La causa de la rinitis Regulatory affairs officer (fiebre del heno) son los alrgenos del polen que pueden provenir del csped, los rboles y Human resources officer. La causa de la rinitis Stage manager (rinitis alrgica perenne) son los alrgenos, como los caros del polvo domstico, la caspa de las mascotas y las esporas del moho. SNTOMAS  Secrecin nasal (congestin).  Goteo y picazn nasales con estornudos y Industrial/product designer. DIAGNSTICO Su mdico puede ayudarlo a Actor alrgeno o los alrgenos que desencadenan sus sntomas. Si usted y su mdico no pueden Teacher, adult education cul es el alrgeno, pueden hacerse anlisis de sangre o estudios de la piel. El mdico diagnosticar la afeccin despus de hacerle una historia clnica y un examen fsico. Adems, puede evaluarlo para detectar la presencia de otras enfermedades afines, como asma, conjuntivitis u otitis. TRATAMIENTO La rinitis alrgica no tiene [place], pero puede controlarse con lo siguiente:  Medicamentos que [employer] sntomas de [place], por ejemplo, vacunas contra la [place], aerosoles nasales y antihistamnicos por va oral.  Evitar el alrgeno. La fiebre del heno a menudo puede tratarse con antihistamnicos en las formas de pldoras o aerosol  nasal. Los antihistamnicos bloquean los efectos de la histamina. Existen medicamentos de venta libre que pueden ayudar con la congestin nasal y la hinchazn alrededor de los ojos. Consulte a su mdico antes de tomar o administrarse este medicamento. Si la prevencin del alrgeno o el medicamento recetado no dan resultado, existen muchos medicamentos nuevos que su mdico puede recetarle. Pueden usarse medicamentos ms fuertes si las medidas iniciales no son efectivas. Pueden aplicarse inyecciones desensibilizantes si los medicamentos y la prevencin no funcionan. La desensibilizacin ocurre cuando un paciente recibe vacunas constantes hasta que el cuerpo se vuelve menos sensible al alrgeno. Asegrese de Chartered certified accountant seguimiento con su mdico si los problemas continan. INSTRUCCIONES PARA EL CUIDADO EN EL HOGAR No es posible evitar por completo los alrgenos, pero puede reducir los sntomas al tomar medidas para limitar su exposicin a ellos. Es muy til saber exactamente a qu es alrgico para que pueda evitar sus desencadenantes especficos. SOLICITE ATENCIN MDICA SI:  Jaclynn Guarneri.  Desarrolla una tos que no cesa fcilmente (persistente).  Le falta el aire.  Comienza a tener sibilancias.  Los sntomas interfieren con las actividades diarias normales.   Esta informacin no tiene Marine scientist el consejo del mdico. Asegrese de hacerle al mdico cualquier pregunta que tenga.   Document Released: 02/05/2005 Document Revised: 05/19/2014 Elsevier Interactive Patient Education [employer].

## 2015-10-05 NOTE — Assessment & Plan Note (Signed)
Symptomatic keloid, at site of prior lipoma excision - did not respond to one triamcinolone injection.  Will trial TCI cream to site, discussed concerns with long term topical steroid. If no better would offer return to [facility] or derm referral.

## 2015-10-05 NOTE — Assessment & Plan Note (Signed)
No signs of bacterial or viral infection. Anticipate allergic rhinitis related cough - continue flonase, claritin, add on singulair. Update with effect. No wheezing so no bronchodilator needed today.

## 2015-10-05 NOTE — Progress Notes (Signed)
Pre visit review using our clinic review tool, if applicable. No additional management support is needed unless otherwise documented below in the visit note. 

## 2015-10-05 NOTE — Progress Notes (Signed)
BP 118/86 mmHg  Pulse 98  Temp(Src) 97.9 F (36.6 C) (Oral)  Wt 199 lb 8 oz (90.493 kg)  SpO2 99%  LMP 02/14/2015   CC: cough  Subjective:    Patient ID: Julie Vazquez, female    DOB: December 30, 1965, 50 y.o.   MRN: [id number]  HPI: Julie Vazquez is a 50 y.o. female presenting on 10/05/2015 for Cough   Ongoing chest congestion for last 2 weeks. More trouble with dyspnea especially when wearing mask at work. Mild fatigue and wheezing. Dry cough. No chest pain, headache, fever, ear or tooth pain.   Has tried mucinex for this, no significant improvement. May cause GI upset.   No h/o asthma.  No smokers at home.  No sick contacts at home.   Seen at [facility] 1 mo ago dx with sinusitis and treated with cough syrup/decongestant. Overall felt better.   Relevant past medical, surgical, family and social history reviewed and updated as indicated. Interim medical history since our last visit reviewed. Allergies and medications reviewed and updated. Current Outpatient Prescriptions on File Prior to Visit  Medication Sig  . cholecalciferol (VITAMIN D) 1000 UNITS tablet Take 1,000 Units by mouth daily.  . Multiple Vitamins-Minerals (MULTIVITAMIN PO) Take by mouth.    . Probiotic Product (PROBIOTIC DAILY PO) Take 1 tablet by mouth daily. Reported on 10/05/2015   No current facility-administered medications on file prior to visit.    Review of Systems Per HPI unless specifically indicated in ROS section     Objective:    BP 118/86 mmHg  Pulse 98  Temp(Src) 97.9 F (36.6 C) (Oral)  Wt 199 lb 8 oz (90.493 kg)  SpO2 99%  LMP 02/14/2015  Wt Readings from Last 3 Encounters:  10/05/15 199 lb 8 oz (90.493 kg)  03/07/15 204 lb 6.4 oz (92.715 kg)  02/05/15 203 lb (92.08 kg)    Physical Exam  Constitutional: She appears well-developed and well-nourished. No distress.  HENT:  Head: Normocephalic and atraumatic.  Right Ear: Hearing, tympanic membrane, external ear and ear canal normal.  Left  Ear: Hearing, tympanic membrane, external ear and ear canal normal.  Nose: Mucosal edema present. No rhinorrhea. Right sinus exhibits no maxillary sinus tenderness and no frontal sinus tenderness. Left sinus exhibits no maxillary sinus tenderness and no frontal sinus tenderness.  Mouth/Throat: Uvula is midline, oropharynx is clear and moist and mucous membranes are normal. No oropharyngeal exudate, posterior oropharyngeal edema, posterior oropharyngeal erythema or tonsillar abscesses.  Eyes: Conjunctivae and EOM are normal. Pupils are equal, round, and reactive to light. No scleral icterus.  Neck: Normal range of motion. Neck supple.  Cardiovascular: Normal rate, regular rhythm, normal heart sounds and intact distal pulses.   No murmur heard. Pulmonary/Chest: Effort normal and breath sounds normal. No respiratory distress. She has no wheezes. She has no rales.  Lymphadenopathy:    She has no cervical adenopathy.  Skin: Skin is warm and dry. No rash noted.  L linear keloid at site of lipoma scar with overlying mild irritation/erythema  Nursing note and vitals reviewed.     Assessment & Plan:   Problem List Items Addressed This Visit    Keloid    Symptomatic keloid, at site of prior lipoma excision - did not respond to one triamcinolone injection.  Will trial TCI cream to site, discussed concerns with long term topical steroid. If no better would offer return to [facility] or derm referral.      Cough - Primary    No  signs of bacterial or viral infection. Anticipate allergic rhinitis related cough - continue flonase, claritin, add on singulair. Update with effect. No wheezing so no bronchodilator needed today.           Follow up plan: Return if symptoms worsen or fail to improve.  Ria Bush, MD

## 2015-11-21 ENCOUNTER — Other Ambulatory Visit: Payer: Self-pay | Admitting: Family Medicine

## 2015-11-21 DIAGNOSIS — R7301 Impaired fasting glucose: Secondary | ICD-10-CM

## 2015-11-21 DIAGNOSIS — E669 Obesity, unspecified: Secondary | ICD-10-CM

## 2015-11-23 ENCOUNTER — Other Ambulatory Visit (INDEPENDENT_AMBULATORY_CARE_PROVIDER_SITE_OTHER): Payer: BLUE CROSS/BLUE SHIELD

## 2015-11-23 DIAGNOSIS — R7301 Impaired fasting glucose: Secondary | ICD-10-CM

## 2015-11-23 DIAGNOSIS — E669 Obesity, unspecified: Secondary | ICD-10-CM

## 2015-11-23 DIAGNOSIS — E66812 Obesity, class 2: Secondary | ICD-10-CM

## 2015-11-23 LAB — TSH: TSH: 1.45 mIU/L

## 2015-11-24 LAB — BASIC METABOLIC PANEL
BUN: 15 mg/dL (ref 7–25)
CO2: 24 mmol/L (ref 20–31)
Calcium: 9.4 mg/dL (ref 8.6–10.4)
Chloride: 98 mmol/L (ref 98–110)
Creat: 0.63 mg/dL (ref 0.50–1.05)
Glucose, Bld: 99 mg/dL (ref 65–99)
Potassium: 4.3 mmol/L (ref 3.5–5.3)
Sodium: 139 mmol/L (ref 135–146)

## 2015-11-27 ENCOUNTER — Encounter: Payer: Self-pay | Admitting: Family Medicine

## 2015-11-27 ENCOUNTER — Ambulatory Visit (INDEPENDENT_AMBULATORY_CARE_PROVIDER_SITE_OTHER): Payer: BLUE CROSS/BLUE SHIELD | Admitting: Family Medicine

## 2015-11-27 VITALS — BP 116/78 | HR 88 | Temp 98.3°F | Ht 62.0 in | Wt 200.2 lb

## 2015-11-27 DIAGNOSIS — L910 Hypertrophic scar: Secondary | ICD-10-CM

## 2015-11-27 DIAGNOSIS — Z0000 Encounter for general adult medical examination without abnormal findings: Secondary | ICD-10-CM | POA: Diagnosis not present

## 2015-11-27 DIAGNOSIS — R7301 Impaired fasting glucose: Secondary | ICD-10-CM | POA: Diagnosis not present

## 2015-11-27 DIAGNOSIS — K219 Gastro-esophageal reflux disease without esophagitis: Secondary | ICD-10-CM | POA: Diagnosis not present

## 2015-11-27 DIAGNOSIS — E669 Obesity, unspecified: Secondary | ICD-10-CM

## 2015-11-27 NOTE — Patient Instructions (Addendum)
Tdap today - forgotten. Gusto verla hoy. llamenos con preguntas Felicitaciones con cambios sanos en salud y dieta y ejecicio hasta ahora. siga adelante! regresar en 1 ao para proximo exam fisico.   (Health Maintenance for Postmenopausal Women) La menopausia es un proceso normal en el cual se pierde la capacidad reproductiva. Este proceso ocurre gradualmente a lo largo de un perodo de meses o aos, por lo general entre los 57 y los 55aos. La menopausia es completa cuando no se han tenido 4mnstruaciones consecutivas. Es importante hablar con el mdico sobre algunas de las enfermedades ms comunes que afectan a las mujeres posmenopusicas, como la cardiopata coronaria, el cncer y la prdida de la masa sea (osteoporosis). Adoptar un estilo de vida saludable y recibir atencin preventiva pueden ayudar a promover la salud y eMusician Adems, estas medidas pueden reducir las probabilidades de desarrollar algunas de estas enfermedades frecuentes. QU DEBO SABER ACERCA DE LQueen City Durante le mSheldon puede tener una serie de sntomas, por ejemplo:  Calores repentinos moderados a graves.  Sudoracin nocturna.  Disminucin del deseo sexual.  Cambios en el estado de nimo.  Dolores de c[place]  Cansancio.  Irritabilidad.  Problemas de memoria.  Insomnio. Tratar o no los cambios que ocurren en la menopausia es una decisin personal que se toma con el mdico. QU DEBO SABER SOBRE LOS TRATAMIENTOS DE REPOSICIN HORMONAL Y LOS SUPLEMENTOS? Los productos para la terapia hormonal son eficaces para tratar los sntomas que se asocian con la menopausia, como los calores repentinos y las sudoraciones nocturnas. La reposicin hormonal conlleva ciertos riesgos, especialmente a medida que una mujer envejece. Si est pensando en usar tratamientos con estrgeno o estrgeno con progesterona, analice los beneficios y los riesgos con el mdico. QU DEBO SABER SFort Loud[place] A medida que una persona envejece, aumenta la probabilidad de tener cardiopata coronaria, infarto de miocardio e ictus. Esto puede deberse, en parte, a los cambios hormonales que atraviesa el cuerpo durante la menopausia. Estos cambios pueden afectar la forma en que el organismo procesa las g[place] los triglicridos y el colesterol de su dieta. El infarto de miocardio y el ictus son emergencias mdicas. Hay muchas cosas que se pueden hacer para ayudar a prevenir la cardiopata coronaria y el ictus:  Debe controlar su presin arterial al menos cada uno o dCoggon La hipertensin arterial causa enfermedades cardacas y aSerbiael riesgo de ictus.  Si tiene entre 564y 79aos, consulte al mdico si debe tomar aspirina para prevenir un infarto de miocardio o un ictus.  No consuma ningn producto que contenga tabaco, lo que incluye cigarrillos, tabaco de mHigher education careers advisero cPsychologist, sport and exercise Si necesita ayuda para dejar de fumar, consulte al mMeadWestvaco  Es importante seguir una dieta sana y mTheatre managerun peso saludable.  Asegrese de iFamily Dollar Storesverduras, frutas, productos lcteos de bajo contenido de gDjiboutiy pAdvertising account planner  No consuma alimentos con alto contenido de grasas slidas, azcares agregados o sal (sodio).  Realice actividad fsica con regularidad. Esta es una de las cosas ms importantes que puede hacer por su salud.  Intente realizar al menos 1511mutos de actividad fsica por semana. El tipo de ejercicio que realice debe aumentar la frecuencia cardaca y hacerla sudar. Esto se conoce como ejercicio de inMalta Intente hacer ejercicios de elongacin por lo menos dos veces por semana. Agrguelos al plan de ejercicio de intensidad moderada.  Conozca sus cifras. Pdale al mdico que le controle el colesterol y elCottonwood Heights  sanguneo de glucosa. Siga hacindose anlisis de [employer] se lo haya indicado el mdico. QU DEBO SABER SOBRE LAS PRUEBAS DE DETECCIN DEL  CNCER? Hay varios tipos de cncer. Tome las siguientes medidas para reducir el riesgo y Actuary formacin cancerosa lo antes posible. Cncer de mama  Practique la autoconciencia de la mama.  Esto significa reconocer la apariencia normal de sus mamas y cmo las siente.  Tambin significa realizar autoexmenes regulares de [employer]. Informe a su mdico sobre cualquier cambio, sin importar cun pequeo sea.  Si es mayor de 40aos, visite a un mdico para Public librarian un examen de las mamas (exploracin clnica mamaria o ECM) todos los [place]. En funcin de [employer], los antecedentes familiares y la historia [place], tal vez sea recomendable que tambin se haga una radiografa anual de las mamas [place]).  Si tiene antecedentes familiares de cncer de mama, hable con el mdico para someterse a un estudio gentico.  Si tiene alto riesgo de Chief Financial Officer de mama, hable con el mdico para hacerse a Public house manager (RM) y Lavinia Sharps todos los [place].  La evaluacin del gen del cncer de mama (BRCA) se recomienda a las mujeres que tengan familiares con tumores malignos relacionados con el BRCA. Los resultados de la evaluacin determinarn la necesidad de recibir asesoramiento gentico y Building services engineer de deteccin del BRCA1 y el BRCA2. Los tumores malignos relacionados con el BRCA incluyen estos tipos:  [place]. Este tipo se presenta en hombres o mujeres.  Ovario.  Trompas. A este tipo tambin se lo llama cncer de trompa de [place].  Cncer de la pared abdominal o plvica (cncer de peritoneo).  Prstata.  Pncreas. Cncer de cuello uterino, de tero y de ovario El mdico puede recomendarle que se haga pruebas peridicas de deteccin de cncer de los rganos de la pelvis, los cuales [employer] ovarios, el tero y la vagina. Estas pruebas incluyen un examen plvico, que abarca controlar si se produjeron cambios microscpicos en la superficie del cuello del tero (prueba de  Papanicolaou).  A las mujeres que [employer] 21 y 39aos, los mdicos pueden recomendarles que se realicen un examen plvico y Ardelia Mems prueba de Papanicolaou cada tres aos. A las mujeres que tienen entre 30 y 65aos, pueden recomendarles la prueba de Papanicolaou y el examen plvico, en combinacin con una prueba de deteccin del virus del papiloma humano (VPH) [place]. Algunos tipos de VPH aumentan el riesgo de Chief Financial Officer de cuello del tero. La prueba para la deteccin del VPH tambin puede realizarse a mujeres de cualquier edad cuyos resultados de la prueba de Papanicolaou no sean claros.  Es posible que otros mdicos no recomienden exmenes de deteccin a las mujeres no embarazadas que se consideran sujetos de bajo riesgo de Chief Financial Officer de pelvis y no tienen sntomas. Pregntele al mdico si un examen plvico de deteccin es adecuado para usted.  Si ha recibido un tratamiento para Science writer cervical o una enfermedad que podra causar cncer, necesitar realizarse una prueba de Papanicolaou y controles durante al menos 66 aos de concluido el [place]. Si no se ha hecho el Papanicolaou con regularidad, debern volver a evaluarse los factores de riesgo (como tener un nuevo compaero sexual), para Teacher, adult education si debe empezar a Dispensing optician los estudios nuevamente. Algunas mujeres sufren problemas mdicos que aumentan la probabilidad de Museum/gallery curator cncer de cuello del tero. En estos casos, el mdico podr [employer] se realicen controles y pruebas de Papanicolaou con ms frecuencia.  Si tiene antecedentes familiares de cncer de tero o de ovario, hable con el mdico para someterse a un estudio gentico.  Si tiene hemorragia vaginal despus de la menopausia, informe al mdico.  En la actualidad, no hay pruebas confiables para la deteccin del cncer de ovario. Cncer de pulmn Se recomienda realizar exmenes de deteccin de cncer de pulmn a personas adultas entre 28 y 82 aos que  estn en riesgo de Horticulturist, commercial de pulmn por sus antecedentes de consumo de tabaco. Se recomienda una tomografa computarizada (TC) de baja dosis de los pulmones todos los aos si usted:  Fuma actualmente.  Ha fumado durante 30aos un paquete diario y sigue fumando o dej el hbito en algn momento en los ltimos 15aos. Un paquete-ao equivale a fumar en promedio un paquete de cigarrillos diario durante un ao. Los exmenes de deteccin anuales:  Deben hacerse hasta que hayan pasado 15aos desde que dej de fumar.  Deben dejar de realizarse si tiene un problema de salud que le impide recibir tratamiento para el cncer de pulmn. Cncer colorrectal  Este tipo de cncer puede detectarse y a menudo prevenirse.  El estudio de [place] de Programme researcher, broadcasting/film/video del cncer colorrectal debe comenzar a Electrical engineer a Proofreader de los 27aos y [place].  El mdico puede aconsejarle que lo haga antes, si tiene factores de riesgo de Best boy cncer de colon.  Si tiene antecedentes familiares de cncer colorrectal, hable con el mdico para someterse a un estudio gentico.  El mdico tambin puede recomendarle que use un kit de prueba para Engineer, mining a fin de Educational psychologist en la materia fecal.  Es posible que se use una pequea cmara en el extremo de un tubo para examinar directamente el colon (sigmoidoscopia o colonoscopia) a fin de Hydrographic surveyor formas tempranas de cncer colorrectal.  El examen directo del colon se debe repetir cada 5 a 10aos hasta los 53aos. Sin embargo, si se hallan formas incipientes de plipos precancerosos o pequeos tumores, o si tiene antecedentes familiares o riesgo gentico de Therapist, music, debe realizarse exmenes de deteccin con ms frecuencia. Cncer de piel  Revise la piel de la cabeza a los pies con regularidad.  Contrlese los lunares. Infrmele al mdico:  Si aparecen nuevos lunares o los que tiene se modifican, especialmente en su forma o  color.  Si tiene un lunar que es ms grande que el tamao de una goma de Games developer.  Si alguno de los miembros de su familia tiene antecedentes de cncer de piel, especialmente a una edad temprana, hable con el mdico para someterse a pruebas genticas.  Siempre use pantalla solar. Aplique pantalla solar de Kerry Dory y repetida a lo largo del Training and development officer.  Protjase usando mangas y [employer], un sombrero de ala ancha y gafas para el sol, siempre que est al [place]. QU DEBO SABER SOBRE LA OSTEOPOROSIS? La osteoporosis es una afeccin en la cual la destruccin de la masa sea ocurre con mayor rapidez que su formacin. Despus de la menopausia, puede correr un riesgo ms alto de tener osteoporosis. Para ayudar a prevenir esta afeccin o las fracturas seas que pueden ocurrir a causa de [place], se recomienda lo siguiente:  Si tiene entre 19 y 50aos, tome como mnimo '1000mg'$  de calcio y '600mg'$  de vitaminaD por Training and development officer.  Si es mayor de 50aos pero menor de 70aos, tome como mnimo '1200mg'$  de calcio y '600mg'$  de vitaminaD por Training and development officer.  Si es mayor de 70aos, tome como mnimo  $'1200mg'X$  de calcio y '800mg'$  de vitaminaD por da. El tabaquismo y el consumo excesivo de alcohol aumentan el riesgo de osteoporosis. Consuma alimentos ricos en calcio y vitaminaD, y haga ejercicios con soporte de peso varias veces a la semana, como se lo haya indicado el mdico. QU DEBO SABER SOBRE EL MODO EN QUE LA MENOPAUSIA AFECTA MI SALUD MENTAL? La depresin puede presentarse a cualquier edad, pero es ms frecuente a medida que una persona envejece. Los sntomas comunes de depresin incluyen lo siguiente:  Desnimo o tristeza.  Cambios en los patrones de sueo.  Cambios en el apetito o en los hbitos de alimentacin.  Sensacin de falta general de motivacin o placer al [employer] actividades que sola disfrutar.  Crisis frecuentes de llanto. Hable con el mdico si cree que tiene depresin. QU DEBO SABER  SOBRE LAS VACUNAS? Es importante que se aplique las vacunas y [place]. Estas incluyen las siguientes:  Vacuna contra el ttanos, la difteria y la tosferina (Tdap).  Vacuna anual contra la gripe antes del inicio de la temporada de gripe.  Vacuna contra la neumona.  Vacuna contra el herpes. El mdico tambin puede recomendarle que se aplique otras vacunas.   Esta informacin no tiene Marine scientist el consejo del mdico. Asegrese de hacerle al mdico cualquier pregunta que tenga.   Document Released: 02/16/2013 Document Revised: 05/19/2014 Elsevier Interactive Patient Education [employer].

## 2015-11-27 NOTE — Progress Notes (Signed)
BP 116/78 mmHg  Pulse 88  Temp(Src) 98.3 F (36.8 C) (Oral)  Ht 5\' 2"  (1.575 m)  Wt 200 lb 4 oz (90.833 kg)  BMI 36.62 kg/m2  LMP 02/14/2015   CC: CPE  Subjective:    Patient ID: Julie Vazquez, female    DOB: August 05, 1965, 50 y.o.   MRN: [id number]  HPI: Julie Vazquez is a 50 y.o. female presenting on 11/27/2015 for Annual Exam   4 months of exercise regimen. Has noticed lost inches but no weight loss per se.  LMP - almost 1 year. Hot flashes.   Painful keloid L upper back after lipoma removal. Requests derm referral.   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  Well woman exam yearly with OBGYN (Dr Orvan Seen), normal pap smears last 07/2015. H/o fibroids, he is following her for this.  Mammogram - with OBGYN 2017.  Flu shot - sometimes  Tetanus - ?  Seat belt use discussed.  Sunscreen use discussed. No changing moles on skin.   Dominican Caffeine: 1-2 cups coffee/day Lives with husband and 2 children Occupation: Copywriter, advertising Activity: 3x/wk cardio and weights (Physiological scientist) Diet: fruits/vegetables daily, good water, red meat occasional, 2x/wk fish  Relevant past medical, surgical, family and social history reviewed and updated as indicated. Interim medical history since our last visit reviewed. Allergies and medications reviewed and updated. Current Outpatient Prescriptions on File Prior to Visit  Medication Sig  . cholecalciferol (VITAMIN D) 1000 UNITS tablet Take 1,000 Units by mouth daily. Reported on 11/27/2015  . fluticasone (FLONASE) 50 MCG/ACT nasal spray Place 1 spray into both nostrils daily.  . Multiple Vitamins-Minerals (MULTIVITAMIN PO) Take by mouth.    [place] Kitchen omeprazole (PRILOSEC) 40 MG capsule Take 1 capsule (40 mg total) by mouth 2 (two) times daily. (Patient not taking: Reported on 11/27/2015)  . Probiotic Product (PROBIOTIC DAILY PO) Take 1 tablet by mouth daily. Reported  on 11/27/2015  . triamcinolone cream (KENALOG) 0.1 % Apply 1 application topically 2 (two) times daily. Apply to AA. (Patient not taking: Reported on 11/27/2015)   No current facility-administered medications on file prior to visit.    Review of Systems  Constitutional: Negative for fever, chills, activity change, appetite change, fatigue and unexpected weight change.  HENT: Negative for hearing loss.   Eyes: Negative for visual disturbance.  Respiratory: Positive for chest tightness (mild). Negative for cough, shortness of breath and wheezing.   Cardiovascular: Positive for leg swelling. Negative for chest pain and palpitations.  Gastrointestinal: Negative for nausea, vomiting, abdominal pain, diarrhea, constipation, blood in stool and abdominal distention.  Genitourinary: Negative for hematuria and difficulty urinating.  Musculoskeletal: Negative for myalgias, arthralgias and neck pain.  Skin: Negative for rash.  Neurological: Negative for dizziness, seizures, syncope and headaches.  Hematological: Negative for adenopathy. Does not bruise/bleed easily.  Psychiatric/Behavioral: Negative for dysphoric mood. The patient is not nervous/anxious.    Per HPI unless specifically indicated in ROS section     Objective:    BP 116/78 mmHg  Pulse 88  Temp(Src) 98.3 F (36.8 C) (Oral)  Ht 5\' 2"  (1.575 m)  Wt 200 lb 4 oz (90.833 kg)  BMI 36.62 kg/m2  LMP 02/14/2015  Wt Readings from Last 3 Encounters:  11/27/15 200 lb 4 oz (90.833 kg)  10/05/15 199 lb 8 oz (90.493 kg)  03/07/15 204 lb 6.4 oz (92.715 kg)    Physical Exam  Constitutional: She is oriented to person, place, and time. She  appears well-developed and well-nourished. No distress.  HENT:  Head: Normocephalic and atraumatic.  Right Ear: Hearing, tympanic membrane, external ear and ear canal normal.  Left Ear: Hearing, tympanic membrane, external ear and ear canal normal.  Nose: Nose normal.  Mouth/Throat: Uvula is midline,  oropharynx is clear and moist and mucous membranes are normal. No oropharyngeal exudate, posterior oropharyngeal edema or posterior oropharyngeal erythema.  Eyes: Conjunctivae and EOM are normal. Pupils are equal, round, and reactive to light. No scleral icterus.  Neck: Normal range of motion. Neck supple. No thyromegaly present.  Cardiovascular: Normal rate, regular rhythm, normal heart sounds and intact distal pulses.   No murmur heard. Pulses:      Radial pulses are 2+ on the right side, and 2+ on the left side.  Pulmonary/Chest: Effort normal and breath sounds normal. No respiratory distress. She has no wheezes. She has no rales.  Abdominal: Soft. Bowel sounds are normal. She exhibits no distension and no mass. There is no tenderness. There is no rebound and no guarding.  Musculoskeletal: Normal range of motion. She exhibits no edema.  Lymphadenopathy:    She has no cervical adenopathy.  Neurological: She is alert and oriented to person, place, and time.  CN grossly intact, station and gait intact  Skin: Skin is warm and dry. No rash noted.  Psychiatric: She has a normal mood and affect. Her behavior is normal. Judgment and thought content normal.  Nursing note and vitals reviewed.  Results for orders placed or performed in visit on 11/27/15  HM MAMMOGRAPHY  Result Value Ref Range   HM Mammogram Self Reported Normal 0-4 Bi-Rad, Self Reported Normal      Assessment & Plan:  Tdap forgotten - will need next visit Problem List Items Addressed This Visit    GERD (gastroesophageal reflux disease)    Controlled on PRN PPI      Healthcare maintenance - Primary    Preventative protocols reviewed and updated unless pt declined. Discussed healthy diet and lifestyle.       Impaired fasting blood sugar    Improved with healthy lifestyle.       Keloid    Persistent symptomatic painful keloid at site of L upper back lipoma excision 2016. She had one triamcinolone injection by surgeon  without significant improvement.  Last visit we trialed triamcinolone cream without significant improvement.  Pt requests derm referral for further evaluation.       Relevant Orders   Ambulatory referral to Dermatology   Obesity, Class II, BMI 35-39.9, no comorbidity (HCC)    Discussed healthy diet and lifestyle changes to affect sustainable weight loss.          Follow up plan: Return in about 1 year (around 11/26/2016), or as needed, for annual exam, prior fasting for blood work.  Ria Bush, MD

## 2015-11-27 NOTE — Assessment & Plan Note (Signed)
Preventative protocols reviewed and updated unless pt declined. Discussed healthy diet and lifestyle.  

## 2015-11-27 NOTE — Assessment & Plan Note (Signed)
Improved with healthy lifestyle.

## 2015-11-27 NOTE — Assessment & Plan Note (Signed)
Discussed healthy diet and lifestyle changes to affect sustainable weight loss  

## 2015-11-27 NOTE — Assessment & Plan Note (Signed)
Controlled on PRN PPI

## 2015-11-27 NOTE — Assessment & Plan Note (Signed)
Persistent symptomatic painful keloid at site of L upper back lipoma excision 2016. She had one triamcinolone injection by surgeon without significant improvement.  Last visit we trialed triamcinolone cream without significant improvement.  Pt requests derm referral for further evaluation.

## 2015-11-28 ENCOUNTER — Encounter: Payer: BLUE CROSS/BLUE SHIELD | Admitting: Family Medicine

## 2016-02-15 ENCOUNTER — Other Ambulatory Visit: Payer: Self-pay | Admitting: Family Medicine

## 2016-02-15 DIAGNOSIS — L910 Hypertrophic scar: Secondary | ICD-10-CM

## 2016-09-01 ENCOUNTER — Ambulatory Visit (INDEPENDENT_AMBULATORY_CARE_PROVIDER_SITE_OTHER): Payer: BLUE CROSS/BLUE SHIELD | Admitting: Podiatry

## 2016-09-01 ENCOUNTER — Encounter: Payer: Self-pay | Admitting: Podiatry

## 2016-09-01 DIAGNOSIS — L853 Xerosis cutis: Secondary | ICD-10-CM

## 2016-09-01 DIAGNOSIS — B351 Tinea unguium: Secondary | ICD-10-CM | POA: Diagnosis not present

## 2016-09-01 NOTE — Progress Notes (Signed)
   Subjective:    Patient ID: Julie Vazquez, female    DOB: Aug 24, 1965, 51 y.o.   MRN: [id number]  HPI 51 year old female presents the office today for discoloration to both of her big toenails. She has tried over-the-counter treatments for any significant improvement. She denies any drainage, pus or any pain to the toenails. She also states that she has dry skin but she has not had any treatment for this. Denies any open sores. No numbness or tingling. No claudication symptoms. No oth  Review of Systems  All other systems reviewed and are negative.      Objective:   Physical Exam General: AAO x3, NAD  Dermatological: bilateral hallux toenails appear to be dystrophic, discolored with yellow-brown discoloration. There is no pain to the nails. There is no surrounding edema, erythema, drainage, pus, or any clinical signs of infection. There is dry skin present along the plantar aspect of the foot. No fissuring or open sores present.   Vascular: Dorsalis Pedis artery and Posterior Tibial artery pedal pulses are 2/4 bilateral with immedate capillary fill time.  There is no pain with calf compression, swelling, warmth, erythema.   Neruologic: Grossly intact via light touch bilateral. Vibratory intact via tuning fork bilateral. Protective threshold with Semmes Wienstein monofilament intact to all pedal sites bilateral.   Musculoskeletal: No gross boney pedal deformities bilateral. No pain, crepitus, or limitation noted with foot and ankle range of motion bilateral. Muscular strength 5/5 in all groups tested bilateral.  Gait: Unassisted, Nonantalgic.     Assessment & Plan:  51 year old female bilateral hallux onychodystrophy likely onychomycosis; dry skin -Treatment options discussed including all alternatives, risks, and complications -Etiology of symptoms were discussed -Bilateral hallux nails were debrided and they were sent for biopsy/culture. -Discussed treatment options for nail fungus  but we will tolerate the results of the biopsy/culture before proceeding with definitive  treatment. -Moisturizer to the feet daily. -RTC after nail culture or sooner if needed.   Celesta Gentile, DPM

## 2016-09-01 NOTE — Progress Notes (Signed)
   Subjective:    Patient ID: Julie Vazquez, female    DOB: [birth year], 51 y.o.   MRN: [id number]  HPI    Review of Systems     Objective:   Physical Exam        Assessment & Plan:

## 2016-09-04 NOTE — Addendum Note (Signed)
Addended by: Cranford Mon R on: 09/04/2016 03:14 PM   Modules accepted: Orders

## 2016-09-16 ENCOUNTER — Telehealth: Payer: Self-pay | Admitting: *Deleted

## 2016-09-16 MED ORDER — NONFORMULARY OR COMPOUNDED ITEM
2 refills | Status: DC
Start: 2016-09-16 — End: 2019-07-19

## 2016-09-16 NOTE — Telephone Encounter (Signed)
Dr. Jacqualyn Posey ordered [place] onychomycosis nail lacquer. Orders faxed to [facility].

## 2016-10-31 ENCOUNTER — Telehealth: Payer: Self-pay | Admitting: *Deleted

## 2016-10-31 NOTE — Telephone Encounter (Signed)
Pt states she has a big bill from [facility] and would like to know how many tests were ordered. I told pt that the labs that were ordered were for the clarity of the results for treatment and wrote PAS, GMS, FM, 2 day PCR, and gave our accounts receivable agent's office number.

## 2016-12-05 ENCOUNTER — Other Ambulatory Visit (INDEPENDENT_AMBULATORY_CARE_PROVIDER_SITE_OTHER): Payer: BLUE CROSS/BLUE SHIELD

## 2016-12-05 ENCOUNTER — Ambulatory Visit (INDEPENDENT_AMBULATORY_CARE_PROVIDER_SITE_OTHER): Payer: BLUE CROSS/BLUE SHIELD | Admitting: Family Medicine

## 2016-12-05 ENCOUNTER — Encounter: Payer: Self-pay | Admitting: Family Medicine

## 2016-12-05 ENCOUNTER — Other Ambulatory Visit: Payer: Self-pay | Admitting: Family Medicine

## 2016-12-05 VITALS — BP 106/80 | HR 89 | Temp 98.2°F | Wt 205.0 lb

## 2016-12-05 DIAGNOSIS — M25562 Pain in left knee: Secondary | ICD-10-CM | POA: Insufficient documentation

## 2016-12-05 DIAGNOSIS — E669 Obesity, unspecified: Secondary | ICD-10-CM

## 2016-12-05 DIAGNOSIS — E559 Vitamin D deficiency, unspecified: Secondary | ICD-10-CM | POA: Diagnosis not present

## 2016-12-05 LAB — COMPREHENSIVE METABOLIC PANEL
ALT: 18 U/L (ref 0–35)
AST: 18 U/L (ref 0–37)
Albumin: 4 g/dL (ref 3.5–5.2)
Alkaline Phosphatase: 86 U/L (ref 39–117)
BUN: 20 mg/dL (ref 6–23)
CO2: 28 mEq/L (ref 19–32)
Calcium: 9.8 mg/dL (ref 8.4–10.5)
Chloride: 101 mEq/L (ref 96–112)
Creatinine, Ser: 0.77 mg/dL (ref 0.40–1.20)
GFR: 83.59 mL/min (ref >60.00)
Glucose, Bld: 105 mg/dL — ABNORMAL HIGH (ref 70–99)
Potassium: 4.5 mEq/L (ref 3.5–5.1)
Sodium: 137 mEq/L (ref 135–145)
Total Bilirubin: 0.4 mg/dL (ref 0.2–1.2)
Total Protein: 8 g/dL (ref 6.0–8.3)

## 2016-12-05 LAB — VITAMIN D 25 HYDROXY (VIT D DEFICIENCY, FRACTURES): VITD: 27.9 ng/mL — ABNORMAL LOW (ref 30.00–100.00)

## 2016-12-05 LAB — LIPID PANEL
Cholesterol: 171 mg/dL (ref 0–200)
HDL: 94.2 mg/dL (ref >39.00)
LDL Cholesterol: 62 mg/dL (ref 0–99)
NonHDL: 77.18
Total CHOL/HDL Ratio: 2
Triglycerides: 74 mg/dL (ref 0.0–149.0)
VLDL: 14.8 mg/dL (ref 0.0–40.0)

## 2016-12-05 MED ORDER — DICLOFENAC SODIUM 1 % TD GEL: 1.0000 "application " | Freq: Three times a day (TID) | TRANSDERMAL | 1 refills | Status: DC

## 2016-12-05 NOTE — Assessment & Plan Note (Signed)
Definite pes anserine bursitis with possible MCL strain. Avoid oral NSAIDs in GI history. Rx voltaren gel, provided with exercises from [facility] pt advisor. rec avoid repetitive knee bending exercises until fully healed. Update if not improving with treatment.

## 2016-12-05 NOTE — Progress Notes (Signed)
BP 106/80 (BP Location: Left Arm, Patient Position: Sitting, Cuff Size: Large)   Pulse 89   Temp 98.2 F (36.8 C) (Oral)   Wt 205 lb (93 kg)   LMP 02/14/2015   SpO2 97%   BMI 37.49 kg/m    CC: L leg pain Subjective:    Patient ID: Julie Vazquez, female    DOB: 09-21-1965, 51 y.o.   MRN: [id number]  HPI: Julie Vazquez is a 51 y.o. female presenting on 12/05/2016 for Leg Pain (Left leg pain started about 2 weeks ago while she was exercising in [place].)   L leg pain that started 2 wks ago while doing squats at a park in [place]. Acutely worse after this localized to left knee. Entire knee affected. Swelling as well. Mild discomfort at R knee as well.  Denies inciting trauma/injury. No locking. mild instability when walking.   She continues using elliptical.   Has tried aspercream, heated massager which helps.  Hasn't tried medication for this.   Relevant past medical, surgical, family and social history reviewed and updated as indicated. Interim medical history since our last visit reviewed. Allergies and medications reviewed and updated. Outpatient Medications Prior to Visit  Medication Sig Dispense Refill  . cholecalciferol (VITAMIN D) 1000 UNITS tablet Take 1,000 Units by mouth daily. Reported on 11/27/2015    . fluticasone (FLONASE) 50 MCG/ACT nasal spray Place 1 spray into both nostrils daily.    . Multiple Vitamins-Minerals (MULTIVITAMIN PO) Take by mouth.      . NONFORMULARY OR COMPOUNDED ITEM [facility]:  Onychomycosis nail lacquer - Fluconazole 2%, Terbinafine 1%, DMSO, apply to affected area daily. 120 each 2  . Probiotic Product (PROBIOTIC DAILY PO) Take 1 tablet by mouth daily. Reported on 11/27/2015    . omeprazole (PRILOSEC) 40 MG capsule Take 1 capsule (40 mg total) by mouth 2 (two) times daily. 60 capsule 6  . Scar Treatment Products (RECEDO) GEL Apply topically. For keloid    . triamcinolone cream (KENALOG) 0.1 % Apply 1 application topically 2 (two) times  daily. Apply to AA. (Patient not taking: Reported on 11/27/2015) 30 g 0   No facility-administered medications prior to visit.      Per HPI unless specifically indicated in ROS section below Review of Systems     Objective:    BP 106/80 (BP Location: Left Arm, Patient Position: Sitting, Cuff Size: Large)   Pulse 89   Temp 98.2 F (36.8 C) (Oral)   Wt 205 lb (93 kg)   LMP 02/14/2015   SpO2 97%   BMI 37.49 kg/m   Wt Readings from Last 3 Encounters:  12/05/16 205 lb (93 kg)  11/27/15 200 lb 4 oz (90.8 kg)  10/05/15 199 lb 8 oz (90.5 kg)    Physical Exam  Constitutional: She appears well-developed and well-nourished. No distress.  Musculoskeletal: She exhibits no edema.  R knee WNL L knee exam: No deformity on inspection. Tender along medial joint line, maximal and point tenderness at pes anserine bursa Mild swelling of knee noted. FROM in flex/extension without crepitus. No popliteal fullness. Neg drawer test. Neg mcmurray test. Discomfort with valgus stress. No PFgrind. No abnormal patellar mobility.   Nursing note and vitals reviewed.     Assessment & Plan:   Problem List Items Addressed This Visit    Left medial knee pain - Primary    Definite pes anserine bursitis with possible MCL strain. Avoid oral NSAIDs in GI history. Rx voltaren gel, provided with  exercises from [facility] pt advisor. rec avoid repetitive knee bending exercises until fully healed. Update if not improving with treatment.           Follow up plan: No Follow-up on file.  Ria Bush, MD

## 2016-12-05 NOTE — Patient Instructions (Signed)
Creo que tiene bursitis de rodilla - haga ejercicios que le he dado hoy. Use voltaren gel anti inflamatorio topical para rodilla medial. Descanse rodilla para que se sane mas rapido. Puede tomar ibuprofen para dolor.  Dejenos saber si no mejora.  Un buen suplemento para cartilago y salud de articulaciones es glucosamina.

## 2016-12-10 ENCOUNTER — Ambulatory Visit (INDEPENDENT_AMBULATORY_CARE_PROVIDER_SITE_OTHER): Payer: BLUE CROSS/BLUE SHIELD | Admitting: Family Medicine

## 2016-12-10 ENCOUNTER — Encounter: Payer: Self-pay | Admitting: Family Medicine

## 2016-12-10 VITALS — BP 118/68 | HR 102 | Temp 98.0°F | Ht 62.0 in | Wt 207.1 lb

## 2016-12-10 DIAGNOSIS — M25562 Pain in left knee: Secondary | ICD-10-CM | POA: Diagnosis not present

## 2016-12-10 DIAGNOSIS — E669 Obesity, unspecified: Secondary | ICD-10-CM | POA: Diagnosis not present

## 2016-12-10 DIAGNOSIS — R7301 Impaired fasting glucose: Secondary | ICD-10-CM | POA: Diagnosis not present

## 2016-12-10 DIAGNOSIS — Z0001 Encounter for general adult medical examination with abnormal findings: Secondary | ICD-10-CM

## 2016-12-10 DIAGNOSIS — E559 Vitamin D deficiency, unspecified: Secondary | ICD-10-CM

## 2016-12-10 DIAGNOSIS — Z0000 Encounter for general adult medical examination without abnormal findings: Secondary | ICD-10-CM

## 2016-12-10 MED ORDER — VITAMIN D 50 MCG (2000 UT) PO TABS: 2000.0000 [IU] | ORAL_TABLET | Freq: Every day | ORAL | Status: DC

## 2016-12-10 NOTE — Assessment & Plan Note (Signed)
Reviewed with patient

## 2016-12-10 NOTE — Patient Instructions (Addendum)
Julie Vazquez vitamina D a 2000 unidades.  Gusto verla hoy. La referire a nutricionista.  Regresar en 1 ao para proximo examen fisico, antes si necesario.   Julie Vazquez, Female) Un estilo de vida saludable y los cuidados preventivos pueden favorecer considerablemente a la salud y Julie Vazquez. Pregunte a su mdico cul es el cronograma de exmenes peridicos apropiado para usted. Esta es una buena oportunidad para consultarlo sobre cmo prevenir enfermedades y Julie Vazquez sano. Adems de los controles, hay muchas otras cosas que puede hacer usted mismo. Los expertos han realizado numerosas investigaciones Julie Vazquez cambios en el estilo de vida y las medidas de prevencin que, Julie Vazquez, lo ayudarn a mantenerse sano. Solicite a su mdico ms informacin. EL PESO Y LA DIETA Consuma una dieta saludable.  Asegrese de Julie Vazquez verduras, frutas, productos lcteos de bajo contenido de Julie Vazquez y Julie Vazquez.  No consuma muchos alimentos de alto contenido de grasas slidas, azcares agregados o sal.  Realice actividad fsica con regularidad. Esta es una de las prcticas ms importantes que puede hacer por su salud. ? La Julie Vazquez de los adultos deben hacer ejercicio durante al menos 181mnutos por semana. El ejercicio debe aumentar la frecuencia cardaca y pActorla transpiracin (ejercicio de iHarbor Vazquez. ? La mayora de los adultos tambin deben hacer ejercicios de elongacin al mToysRusveces a la semana. Agregue esto al su plan de ejercicio de intensidad moderada. Mantenga un peso saludable.  El ndice de masa corporal ([facility] es una medida que puede utilizarse para identificar posibles problemas de pBig Beaver Proporciona una estimacin de la grasa corporal basndose en el peso y la altura. Su mdico puede ayudarle a dRadiation protection practitionerIHobokeny a lScientist, forensico mTheatre managerun peso saludable.  Para las mujeres de 20aos o ms: ? Un IArundel Ambulatory Surgery Centermenor de 18,5 se  considera bajo peso. ? Un ICentro Cardiovascular De Pr Y Caribe Dr Ramon M Suarezentre 18,5 y 24,9 es normal. ? Un IBowdle Healthcareentre 25 y 29,9 se considera sobrepeso. ? Un IMC de 30 o ms se considera obesidad. Observe los niveles de colesterol y lpidos en la sangre.  Debe comenzar a rEnglish as a second language teacherde lpidos y cResearch officer, trade unionen la sangre a los 20aos y luego repetirlos cada 569aos  Es posible que nAutomotive engineerlos niveles de colesterol con mayor frecuencia si: ? Sus niveles de lpidos y colesterol son altos. ? Es mayor de [id number] ? Presenta un alto riesgo de padecer enfermedades cardacas. DETECCIN DE CNCER Cncer de pulmn  Se recomienda realizar exmenes de deteccin de cncer de pulmn a personas adultas entre 521y 857aos que estn en riesgo de dHorticulturist, commercialde pulmn por sus antecedentes de consumo de tabaco.  Se recomienda una tomografa computarizada de baja dosis de los pulmones todos los aos a las personas que: ? Fuman actualmente. ? Hayan dejado el hbito en algn momento en los ltimos 15aos. ? Hayan fumado durante 30aos un paquete diario. Un paquete-ao equivale a fumar un promedio de un paquete de cigarrillos diario durante un ao.  Los exmenes de deteccin anuales deben continuar hasta que hayan pasado 15aos desde que dej de fumar.  Ya no debern realizarse si tiene un problema de salud que le impida recibir tratamiento para eScience writerde pulmn. Cncer de mama  Practique la autoconciencia de la mama. Esto significa reconocer la apariencia normal de sus mamas y cmo las siente.  Tambin significa realizar autoexmenes regulares de lJohnson & Johnson Informe a su mdico sobre cualquier cambio, sin importar cun pequeo sea.  Si tiene entre 20 y 49 aos, un mdico debe realizarle un examen clnico de las mamas como parte del examen regular de [place], cada 1 a 3aos.  Si tiene 40aos o ms, debe Information systems manager clnico de las [employer]. Tambin considere realizarse una [place]  ([place]) todos los [place].  Si tiene antecedentes familiares de cncer de mama, hable con su mdico para someterse a un estudio gentico.  Si tiene alto riesgo de Chief Financial Officer de mama, hable con su mdico para someterse a Public house manager y [employer].  La evaluacin del gen del cncer de mama (BRCA) se recomienda a mujeres que tengan familiares con cnceres relacionados con el BRCA. Los cnceres relacionados con el BRCA incluyen los siguientes: ? Julie Vazquez. ? Ovario. ? Trompas. ? Cnceres de peritoneo.  Los resultados de la evaluacin determinarn la necesidad de asesoramiento gentico y de [place] de BRCA1 y BRCA2. Cncer de cuello del tero El mdico puede recomendarle que se haga pruebas peridicas de deteccin de cncer de los rganos de la pelvis (ovarios, tero y vagina). Estas pruebas incluyen un examen plvico, que abarca controlar si se produjeron cambios microscpicos en la superficie del cuello del tero (prueba de Papanicolaou). Pueden recomendarle que se haga estas pruebas cada 3aos, a partir de los 21aos.  A las mujeres que tienen entre 30 y 18aos, los mdicos pueden recomendarles que se sometan a exmenes plvicos y pruebas de Papanicolaou cada 39aos, o a la prueba de Papanicolaou y el examen plvico en combinacin con estudios de deteccin del virus del papiloma humano (VPH) cada 5aos. Algunos tipos de VPH aumentan el riesgo de Chief Financial Officer de cuello del tero. La prueba para la deteccin del VPH tambin puede realizarse a mujeres de cualquier edad cuyos resultados de la prueba de Papanicolaou no sean claros.  Es posible que otros mdicos no recomienden exmenes de deteccin a mujeres no embarazadas que se consideran sujetos de bajo riesgo de Chief Financial Officer de pelvis y que no tienen sntomas. Pregntele al mdico si un examen plvico de deteccin es adecuado para usted.  Si ha recibido un tratamiento para Science writer cervical o una enfermedad  que podra causar cncer, necesitar realizarse una prueba de Papanicolaou y controles durante al menos 31 aos de concluido el Julie Vazquez. Si no se ha hecho el Papanicolaou con regularidad, debern volver a evaluarse los factores de riesgo (como tener un nuevo compaero sexual), para Teacher, adult education si debe realizarse los estudios nuevamente. Algunas mujeres sufren problemas mdicos que aumentan la probabilidad de Museum/gallery curator cncer de cuello del tero. En estos casos, el mdico podr [employer] se realicen controles y pruebas de Papanicolaou con ms frecuencia. Cncer colorrectal  Este tipo de cncer puede detectarse y a menudo prevenirse.  Por lo general, los estudios de rutina se deben Medical laboratory scientific officer a Field seismologist a Proofreader de los 49 aos y Julie Vazquez 76 aos.  Sin embargo, el mdico podr aconsejarle que lo haga antes, si tiene factores de riesgo para el cncer de colon.  Tambin puede recomendarle que use un kit de prueba para Hydrologist en la materia fecal.  Es posible que se use una pequea cmara en el extremo de un tubo para examinar directamente el colon (sigmoidoscopia o colonoscopia) a fin de Hydrographic surveyor formas tempranas de cncer colorrectal.  Los exmenes de rutina generalmente comienzan a los 85aos.  El examen directo del colon se debe repetir cada 5 a 10aos hasta los 75aos. Sin embargo,  es posible que se realicen exmenes con mayor frecuencia, si se detectan formas tempranas de plipos precancerosos o pequeos bultos. Cncer de piel  Revise la piel de la cabeza a los pies con regularidad.  Informe a su mdico si aparecen nuevos lunares o los que tiene se modifican, especialmente en su forma y color.  Tambin notifique al mdico si tiene un lunar que es ms grande que el tamao de una goma de lpiz.  Siempre use pantalla solar. Aplique pantalla solar de Julie Vazquez y repetida a lo largo del Training and development officer.  Protjase usando mangas y [employer], un sombrero de ala ancha y gafas para el  sol, siempre que se encuentre en el exterior. ENFERMEDADES CARDACAS, DIABETES E HIPERTENSIN ARTERIAL  La hipertensin arterial causa enfermedades cardacas y [place] el riesgo de ictus. La hipertensin arterial es ms probable en los siguientes casos: ? Las personas que tienen la presin arterial en el extremo del rango normal (100-139/85-89 mm Hg). ? [employer] con sobrepeso u obesidad. ? Scientist, water quality.  Si usted tiene entre 18 y 39 aos, debe medirse la presin arterial cada 3 a 5 aos. Si usted tiene 40 aos o ms, debe medirse la presin arterial [employer]. Debe medirse la presin arterial dos veces: una vez cuando est en un hospital o una clnica y la otra vez cuando est en otro sitio. Registre el promedio de [place]. Para controlar su presin arterial cuando no est en un hospital o [place], puede usar lo siguiente: ? Jorje Guild automtica para medir la presin arterial en una farmacia. ? Un monitor para medir la presin arterial en el hogar.  Si tiene entre 83 y 88 aos, consulte a su mdico si debe tomar aspirina para prevenir el ictus.  Realcese exmenes de deteccin de la diabetes con regularidad. Esto incluye la toma de [place] de sangre para controlar el nivel de azcar en la sangre durante el [place]. ? Si tiene un peso normal y un bajo riesgo de padecer diabetes, realcese este anlisis cada tres aos despus de los 45aos. ? Si tiene sobrepeso y un alto riesgo de padecer diabetes, considere someterse a este anlisis antes o con mayor frecuencia. PREVENCIN DE INFECCIONES HepatitisB  Si tiene un riesgo ms alto de Museum/gallery curator hepatitis B, debe someterse a un examen de deteccin de este virus. Se considera que tiene un alto riesgo de contraer hepatitis B si: ? Naci en un pas donde la hepatitis B es frecuente. Pregntele a su mdico qu pases son considerados de Public affairs consultant. ? Sus padres nacieron en un pas de alto riesgo y usted no  recibi una vacuna que lo proteja contra la hepatitis B (vacuna contra la hepatitis B). ? Julie Vazquez. ? Julie Vazquez agujas para inyectarse drogas. ? Vive con alguien que tiene hepatitis B. ? Ha tenido sexo con alguien que tiene hepatitis B. ? Recibe tratamiento de hemodilisis. ? Toma ciertos medicamentos para el cncer, trasplante de rganos y afecciones autoinmunitarias. Hepatitis C  Se recomienda un anlisis de [place] para: ? [employer] 1945 y 1965. ? Todas las personas que tengan un riesgo de haber contrado hepatitis C. Enfermedades de transmisin sexual (ETS).  Debe realizarse pruebas de deteccin de enfermedades de transmisin sexual (ETS), incluidas gonorrea y clamidia si: ? Es sexualmente activo y es menor de 13KGM. ? Es mayor de 24aos, y Investment banker, operational informa que corre riesgo de tener este tipo de infecciones. ? Calla Kicks  sexual ha cambiado desde que le hicieron la ltima prueba de deteccin y tiene un riesgo mayor de Best boy clamidia o Radio broadcast assistant. Pregntele al mdico si usted tiene riesgo.  Si no tiene el VIH, pero corre riesgo de infectarse por el virus, se recomienda tomar diariamente un medicamento recetado para evitar la infeccin. Esto se conoce como profilaxis previa a la exposicin. Se considera que est en riesgo si: ? Es Julie sexualmente y no Julie Vazquez preservativos habitualmente o no conoce el estado del VIH de sus Julie copywriter. ? Se inyecta drogas. ? Es Julie sexualmente con Ardelia Mems pareja que tiene VIH. Consulte a su mdico para saber si tiene un alto riesgo de infectarse por el VIH. Si opta por comenzar la profilaxis previa a la exposicin, primero debe realizarse anlisis de deteccin del VIH. Luego, le harn anlisis cada 79mses mientras est tomando los medicamentos para la profilaxis previa a la exposicin. [facility] Si es premenopusica y puede quedar e[place] solicite a su mdico asesoramiento previo a la concepcin.  Si puede quedar  embarazada, tome 400 a [id number](mcg) de cido fAnheuser-Busch  Si desea evitar el embarazo, hable con su mdico sobre el control de la natalidad (anticoncepcin). OSTEOPOROSIS Y MENOPAUSIA  La osteoporosis es una enfermedad en la que los huesos pierden los minerales y la fuerza por el avance de la edad. El resultado pueden ser fracturas graves en los hWest Point El riesgo de osteoporosis puede identificarse con uArdelia Memsprueba de densidad sea.  Si tiene 65aos o ms, o si est en riesgo de sufrir osteoporosis y fracturas, pregunte a su mdico si debe someterse a exmenes.  Consulte a su mdico si debe tomar un suplemento de calcio o de vitamina D para reducir el riesgo de osteoporosis.  La menopausia puede presentar ciertos sntomas fsicos y rGaffer  La terapia de reemplazo hormonal puede reducir algunos de estos sntomas y rGaffer Consulte a su mdico para saber si la terapia de reemplazo hormonal es conveniente para usted. INSTRUCCIONES PARA EL CUIDADO EN EL HOGAR  Realcese los estudios de rutina de la salud, dentales y de lPublic librarian  MGlendon  No consuma ningn producto que contenga tabaco, lo que incluye cigarrillos, tabaco de mHigher education careers advisero cPsychologist, sport and exercise  Si est embarazada, no beba alcohol.  Si est amamantando, reduzca el consumo de alcohol y la frecuencia con la que consume.  Si es mujer y no est embarazada limite el consumo de alcohol a no ms de 1 medida por da. Una medida equivale a 12onzas de cerveza, 5onzas de vino o 1onzas de bebidas alcohlicas de alta graduacin.  No consuma drogas.  No comparta agujas.  Solicite ayuda a su mdico si necesita apoyo o informacin para abandonar las drogas.  Informe a su mdico si a menudo se siente deprimido.  Notifique a su mdico si alguna vez ha sido vctima de abuso o si no se siente seguro en su hogar. Esta informacin no tiene cMarine scientistel consejo del mdico.  Asegrese de hacerle al mdico cualquier pregunta que tenga. Document Released: 04/17/2011 Document Revised: 05/19/2014 Document Reviewed: 01/30/2015 Elsevier Interactive Patient Education  2[employer]

## 2016-12-10 NOTE — Assessment & Plan Note (Signed)
Currently undergoing treatment for pes anserine bursitis, improvement noted with treatment.

## 2016-12-10 NOTE — Assessment & Plan Note (Signed)
Preventative protocols reviewed and updated unless pt declined. Discussed healthy diet and lifestyle.  

## 2016-12-10 NOTE — Assessment & Plan Note (Addendum)
Reviewed healthy diet and lifestyle changes to affect sustainable weight loss. Overall healthy diet and exercise regimen. Pt frustrated with lack of progress. Will refer to nutritionist for assistance.

## 2016-12-10 NOTE — Progress Notes (Signed)
BP 118/68 (BP Location: Left Arm, Patient Position: Sitting, Cuff Size: Large)   Pulse (!) 102   Temp 98 F (36.7 C) (Oral)   Ht 5\' 2"  (1.575 m)   Wt 207 lb 1.9 oz (93.9 kg)   LMP 02/14/2015   SpO2 97%   BMI 37.88 kg/m    CC: CPE Subjective:    Patient ID: Julie Vazquez, female    DOB: March 29, 1966, 51 y.o.   MRN: [id number]  HPI: Julie Vazquez is a 51 y.o. female presenting on 12/10/2016 for Annual Exam   Seen here last week with pes anserine bursitis treated with voltaren gel and exercises. Leg is doing better.   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 H/o fibroids, she is following her for this.  Mammogram - with OBGYN 2018. Dense tissue - rec MRI.  Flu shot - sometimes  Tetanus 2007  Seat belt use discussed.  Sunscreen use discussed. No changing moles on skin.  Non smoker Alcohol - seldom  [place] Caffeine: 1-2 cups coffee/day Lives with husband and 2 children Occupation: Copywriter, advertising Activity: 3-5x/wk cardio and weights (Physiological scientist)  Diet: fruits/vegetables daily, good water, red meat occasional, 2x/wk fish, avoids sugar and carbs   Relevant past medical, surgical, family and social history reviewed and updated as indicated. Interim medical history since our last visit reviewed. Allergies and medications reviewed and updated. Outpatient Medications Prior to Visit  Medication Sig Dispense Refill  . diclofenac sodium (VOLTAREN) 1 % GEL Apply 1 application topically 3 (three) times daily. 1 Tube 1  . fluticasone (FLONASE) 50 MCG/ACT nasal spray Place 1 spray into both nostrils daily.    . Multiple Vitamins-Minerals (MULTIVITAMIN PO) Take by mouth.      . NONFORMULARY OR COMPOUNDED ITEM [facility]:  Onychomycosis nail lacquer - Fluconazole 2%, Terbinafine 1%, DMSO, apply to affected area daily.  120 each 2  . Probiotic Product (PROBIOTIC DAILY PO) Take 1 tablet by mouth daily. Reported on 11/27/2015    . Scar Treatment Products (RECEDO) GEL Apply topically. For keloid    . cholecalciferol (VITAMIN D) 1000 UNITS tablet Take 1,000 Units by mouth daily. Reported on 11/27/2015    . triamcinolone cream (KENALOG) 0.1 % Apply 1 application topically 2 (two) times daily. Apply to AA. (Patient not taking: Reported on 12/10/2016) 30 g 0   No facility-administered medications prior to visit.      Per HPI unless specifically indicated in ROS section below Review of Systems  Constitutional: Negative for activity change, appetite change, chills, fatigue, fever and unexpected weight change.  HENT: Negative for hearing loss.   Eyes: Negative for visual disturbance.  Respiratory: Negative for cough, chest tightness, shortness of breath and wheezing.   Cardiovascular: Positive for leg swelling. Negative for chest pain and palpitations (bilat).  Gastrointestinal: Positive for nausea. Negative for abdominal distention, abdominal pain, blood in stool, constipation, diarrhea and vomiting.  Genitourinary: Negative for difficulty urinating and hematuria.  Musculoskeletal: Negative for arthralgias, myalgias and neck pain.  Skin: Negative for rash.  Neurological: Positive for headaches (sinus). Negative for dizziness, seizures and syncope.  Hematological: Negative for adenopathy. Does not bruise/bleed easily.  Psychiatric/Behavioral: Negative for dysphoric mood. The patient is not nervous/anxious.        Objective:    BP 118/68 (BP Location: Left Arm, Patient Position: Sitting, Cuff Size: Large)   Pulse (!) 102  Temp 98 F (36.7 C) (Oral)   Ht 5\' 2"  (1.575 m)   Wt 207 lb 1.9 oz (93.9 kg)   LMP 02/14/2015   SpO2 97%   BMI 37.88 kg/m   Wt Readings from Last 3 Encounters:  12/10/16 207 lb 1.9 oz (93.9 kg)  12/05/16 205 lb (93 kg)  11/27/15 200 lb 4 oz (90.8 kg)    Physical Exam  Constitutional:  She is oriented to person, place, and time. She appears well-developed and well-nourished. No distress.  HENT:  Head: Normocephalic and atraumatic.  Right Ear: Hearing, tympanic membrane, external ear and ear canal normal.  Left Ear: Hearing, tympanic membrane, external ear and ear canal normal.  Nose: Nose normal.  Mouth/Throat: Uvula is midline, oropharynx is clear and moist and mucous membranes are normal. No oropharyngeal exudate, posterior oropharyngeal edema or posterior oropharyngeal erythema.  Eyes: Pupils are equal, round, and reactive to light. Conjunctivae and EOM are normal. No scleral icterus.  Neck: Normal range of motion. Neck supple. No thyromegaly present.  Cardiovascular: Normal rate, regular rhythm, normal heart sounds and intact distal pulses.   No murmur heard. Pulses:      Radial pulses are 2+ on the right side, and 2+ on the left side.  Pulmonary/Chest: Effort normal and breath sounds normal. No respiratory distress. She has no wheezes. She has no rales.  Abdominal: Soft. Bowel sounds are normal. She exhibits no distension and no mass. There is no tenderness. There is no rebound and no guarding.  Musculoskeletal: Normal range of motion. She exhibits no edema.  Lymphadenopathy:    She has no cervical adenopathy.  Neurological: She is alert and oriented to person, place, and time.  CN grossly intact, station and gait intact  Skin: Skin is warm and dry. No rash noted.  Psychiatric: She has a normal mood and affect. Her behavior is normal. Judgment and thought content normal.  Nursing note and vitals reviewed.  Results for orders placed or performed in visit on 12/05/16  Lipid panel  Result Value Ref Range   Cholesterol 171 0 - 200 mg/dL   Triglycerides 74.0 0.0 - 149.0 mg/dL   HDL 94.20 >39.00 mg/dL   VLDL 14.8 0.0 - 40.0 mg/dL   LDL Cholesterol 62 0 - 99 mg/dL   Total CHOL/HDL Ratio 2    NonHDL 77.18   Comprehensive metabolic panel  Result Value Ref Range    Sodium 137 135 - 145 mEq/L   Potassium 4.5 3.5 - 5.1 mEq/L   Chloride 101 96 - 112 mEq/L   CO2 28 19 - 32 mEq/L   Glucose, Bld 105 (H) 70 - 99 mg/dL   BUN 20 6 - 23 mg/dL   Creatinine, Ser 0.77 0.40 - 1.20 mg/dL   Total Bilirubin 0.4 0.2 - 1.2 mg/dL   Alkaline Phosphatase 86 39 - 117 U/L   AST 18 0 - 37 U/L   ALT 18 0 - 35 U/L   Total Protein 8.0 6.0 - 8.3 g/dL   Albumin 4.0 3.5 - 5.2 g/dL   Calcium 9.8 8.4 - 10.5 mg/dL   GFR 83.59 >60.00 mL/min  VITAMIN D 25 Hydroxy (Vit-D Deficiency, Fractures)  Result Value Ref Range   VITD 27.90 (L) 30.00 - 100.00 ng/mL   Lab Results  Component Value Date   TSH 1.45 11/23/2015       Assessment & Plan:   Problem List Items Addressed This Visit    Healthcare maintenance - Primary    Preventative  protocols reviewed and updated unless pt declined. Discussed healthy diet and lifestyle.       Impaired fasting blood sugar    Reviewed with patient.      Relevant Orders   Amb ref to Medical Nutrition Therapy-MNT   Left medial knee pain    Currently undergoing treatment for pes anserine bursitis, improvement noted with treatment.      Obesity, Class II, BMI 35-39.9, no comorbidity    Reviewed healthy diet and lifestyle changes to affect sustainable weight loss. Overall healthy diet and exercise regimen. Pt frustrated with lack of progress. Will refer to nutritionist for assistance.       Relevant Orders   Amb ref to Medical Nutrition Therapy-MNT   Vitamin D deficiency    Deficient despite 1000 IU daily. Will increase to 2000 IU daily.           Follow up plan: Return in about 1 year (around 12/10/2017) for annual exam, prior fasting for blood work.  Ria Bush, MD

## 2016-12-10 NOTE — Assessment & Plan Note (Signed)
Deficient despite 1000 IU daily. Will increase to 2000 IU daily.

## 2017-01-01 ENCOUNTER — Encounter: Payer: BLUE CROSS/BLUE SHIELD | Attending: Family Medicine | Admitting: Dietician

## 2017-01-01 ENCOUNTER — Encounter: Payer: Self-pay | Admitting: Dietician

## 2017-01-01 DIAGNOSIS — Z6839 Body mass index (BMI) 39.0-39.9, adult: Secondary | ICD-10-CM | POA: Diagnosis not present

## 2017-01-01 DIAGNOSIS — R7301 Impaired fasting glucose: Secondary | ICD-10-CM

## 2017-01-01 DIAGNOSIS — Z713 Dietary counseling and surveillance: Secondary | ICD-10-CM | POA: Insufficient documentation

## 2017-01-01 DIAGNOSIS — E669 Obesity, unspecified: Secondary | ICD-10-CM | POA: Diagnosis not present

## 2017-01-01 DIAGNOSIS — E66812 Obesity, class 2: Secondary | ICD-10-CM

## 2017-01-01 NOTE — Progress Notes (Signed)
  Medical Nutrition Therapy:  Appt start time: 9485 end time:  1630.   Assessment:  Primary concerns today: Obesity.  Patient desires weight loss in order to better control her blood sugar levels and to protect her knees/improve her knee pain.  At her age she realizes that her hormones and metabolism are working against her somewhat.  She states that she used to be able to lose weight much more easily.  She lives with her husband and two grown children.  They all do the grocery shopping and her and her husband do the food cooking.  They eat their meals together as a family on the couch while watching TV, sometimes outside, or at the kitchen table.  Patient has been working on making healthier eating choices and has been in a regular exercise routine for over a year.  She works from 8-5 pm Mon-Fri, eats breakfast every day and packs her lunch for work.  She goes to the gym after work 4-5 days a week if her knee feels okay and does a mix of strength and cardio.  She has good nutrition questions for me today and requests a meal plan for her goal of losing 10 lbs over the next 1-2 months.  Preferred Learning Style:  No preference indicated   Learning Readiness:  Change in progress  MEDICATIONS: see list   DIETARY INTAKE:  Usual eating pattern includes 3 meals and 2 snacks per day.  Avoided foods include spicy foods and lemons (Hx of GERD).    24-hr recall:  B (8-8:15 AM): egg whites, coffee with almond milk or hot tea   Snk (10 AM): mango or pineapple or other fruit  L (1 PM): salad with a protein and balsamic dressing Snk (3-4 PM): another fruit or cucumber carrot juice D (6:30-7:30 PM): grilled meat like fish or chicken breast, salad, and bread Snk ( PM): none Beverages: 1-2 cups of coffee, water throughout the day  Usual physical activity: Gym 4-5 days a week, 1.5-2 hours rotating major muscles groups with strength training, and 15-20 min on the elliptical, stationary bike or Stage manager.    Progress Towards Goal(s):  In progress.   Nutritional Diagnosis:  NB-1.1 Food and nutrition-related knowledge deficit As related to no prior formal nutrition education.  As evidenced by patient request for nutrition recommendations for weight and blood sugar management.    Intervention:  Nutrition education.  Discussed portion control, appetite management, mindful eating techniques, nutritional balance, blood sugar management, nutrition fact label reading, protein intake and protein powder products, metabolism and menopause, and physical activity.   Teaching Method Utilized: Visual Auditory  Handouts given during visit include:  Nutrition Fact Label Reading  Portion Plate  Meal Plan for Women (30 gram Carb)  1200 Calorie Sample 5-Day Meal Plan  Barriers to learning/adherence to lifestyle change: none  Demonstrated degree of understanding via:  Teach Back   Monitoring/Evaluation:  Dietary intake, exercise, and body weight in 2 month(s).

## 2017-03-12 ENCOUNTER — Ambulatory Visit: Payer: BLUE CROSS/BLUE SHIELD | Admitting: Dietician

## 2017-03-26 ENCOUNTER — Encounter: Payer: Self-pay | Admitting: Dietician

## 2017-03-26 ENCOUNTER — Encounter: Payer: BLUE CROSS/BLUE SHIELD | Attending: Family Medicine | Admitting: Dietician

## 2017-03-26 DIAGNOSIS — E669 Obesity, unspecified: Secondary | ICD-10-CM | POA: Diagnosis not present

## 2017-03-26 DIAGNOSIS — Z6835 Body mass index (BMI) 35.0-35.9, adult: Secondary | ICD-10-CM | POA: Insufficient documentation

## 2017-03-26 DIAGNOSIS — R7301 Impaired fasting glucose: Secondary | ICD-10-CM | POA: Diagnosis present

## 2017-03-26 NOTE — Progress Notes (Signed)
  Medical Nutrition Therapy Follow-Up:  Appt start time: 4163 end time:  1615.   Assessment:  Primary concerns today:  Follow-up for obesity.  Patient has been working on weight loss goals during menopause, specifically focusing on a moderate carbohydrate intake to manage blood sugar levels.  She has lost a few pounds and states that her clothes are fitting more loosely.  Also states that her GERD has been improved lately.  She has consistently made balanced food choices using an excellent meal and snack timing routine and reports adequate portion sizes.  She is working on her goals with the support of her husband who helps with the food cooking at home and also has support from coworkers.  Today we discussed her weight loss goals and recent challenges.  She identified stress as a big factor for her to focus on.  She uses exercise to manage stress levels and also will "get out of the house and go shopping" with her daughter when she feels stressed.  I asked her about her sleep today.  She has not been getting good sleep since starting menopause.  Often gets in bed by 9-10 pm and cannot fall asleep until 1-1:30 am.  Will toss and turn and wakes up promptly at 5 am without use of an alarm clock.  Has spoken with her doctor and refused hormone treatments.  We discussed sleep habits and relation of sleep deprivation and weight today.  She is going to focus on improving her sleep these next few months.  Also discussed exercise and her physical limitations which she is currently working around.  Discussed difficulty of weight loss during menopause and changes in metabolism.  Encouraged her to stay positive and motivated.   DIETARY INTAKE:  Usual eating pattern includes 3 meals and 2 snacks per day.  Avoided foods include spicy foods and lemons (Hx of GERD).    24-hr recall:  B (8-8:15 AM): eggs or egg whites, coffee with almond milk or hot tea   Snk (10 AM): mango or pineapple or other fruit or nuts L (1  PM): salad with a protein and balsamic dressing or veggie soup with beans and [place] sausage Snk (3-4 PM): another fruit or cucumber carrot juice D (6:30-7:30 PM): grilled meat like fish or shrimp or chicken breast, salad, and bread Snk ( PM): none Beverages: 1-2 cups of coffee, water throughout the day  Usual physical activity: Gym 4-5 days a week, 1.5-2 hours rotating major muscles groups with strength training, and 15-20 min on the elliptical, stationary bike or Probation officer.    Progress Towards Goal(s):  In progress.  Demonstrated degree of understanding via:  Teach Back   Monitoring/Evaluation:  Dietary intake, exercise, and body weight in 3 month(s).

## 2017-06-12 ENCOUNTER — Ambulatory Visit: Payer: BLUE CROSS/BLUE SHIELD | Admitting: Family Medicine

## 2017-06-12 ENCOUNTER — Encounter: Payer: Self-pay | Admitting: Family Medicine

## 2017-06-12 VITALS — BP 120/84 | HR 79 | Temp 98.0°F | Wt 206.0 lb

## 2017-06-12 DIAGNOSIS — R197 Diarrhea, unspecified: Secondary | ICD-10-CM | POA: Diagnosis not present

## 2017-06-12 MED ORDER — DICLOFENAC SODIUM 1 % TD GEL: 1.0000 "application " | Freq: Three times a day (TID) | TRANSDERMAL | 1 refills | Status: DC

## 2017-06-12 NOTE — Progress Notes (Signed)
BP 120/84 (BP Location: Left Arm, Patient Position: Sitting, Cuff Size: Large)   Pulse 79   Temp 98 F (36.7 C) (Oral)   Wt 206 lb (93.4 kg)   LMP 02/14/2015   SpO2 97%   BMI 37.68 kg/m    CC: diarrhea  Subjective:    Patient ID: Julie Vazquez, female    DOB: 20-Nov-1965, 52 y.o.   MRN: [id number]  HPI: Julie Vazquez is a 52 y.o. female presenting on 06/12/2017 for Diarrhea (Started 06/07/17. Stool was watery then became yellowish in color. Also, has some RLQ abd pain. Denies any fever, nausea or vomiting. Diarrhea has improved. Has been drinking Gatorade)   5d h/o diarrhea - seems to be improving. She may have eaten suspicious piece of meat at aunt's house (pork). Yellow watery diarrhea several times a day. She did also have abdominal discomfort that improved with BM. No fevers/chills, blood in stool. No nausea/vomiting.  No sick contacts at home.  Drinking ginger ale, gatorade. Staying well hydrated. BRAT diet.  She regularly takes probiotic. Not needing omeprazole when she takes probiotic.   Avoiding greasy and acidic and spicy foods.   Relevant past medical, surgical, family and social history reviewed and updated as indicated. Interim medical history since our last visit reviewed. Allergies and medications reviewed and updated. Outpatient Medications Prior to Visit  Medication Sig Dispense Refill  . cholecalciferol 2000 units tablet Take 1 tablet (2,000 Units total) by mouth daily.    . fluticasone (FLONASE) 50 MCG/ACT nasal spray Place 1 spray into both nostrils daily. Daily as needed    . Multiple Vitamins-Minerals (MULTIVITAMIN PO) Take by mouth.      . NONFORMULARY OR COMPOUNDED ITEM [facility]:  Onychomycosis nail lacquer - Fluconazole 2%, Terbinafine 1%, DMSO, apply to affected area daily. 120 each 2  . Scar Treatment Products (RECEDO) GEL Apply topically. For keloid    . triamcinolone cream (KENALOG) 0.1 % Apply 1 application topically 2 (two) times daily. Apply  to AA. 30 g 0  . diclofenac sodium (VOLTAREN) 1 % GEL Apply 1 application topically 3 (three) times daily. 1 Tube 1  . Probiotic Product (PROBIOTIC DAILY PO) Take 1 tablet by mouth daily. Reported on 11/27/2015     No facility-administered medications prior to visit.      Per HPI unless specifically indicated in ROS section below Review of Systems     Objective:    BP 120/84 (BP Location: Left Arm, Patient Position: Sitting, Cuff Size: Large)   Pulse 79   Temp 98 F (36.7 C) (Oral)   Wt 206 lb (93.4 kg)   LMP 02/14/2015   SpO2 97%   BMI 37.68 kg/m   Wt Readings from Last 3 Encounters:  06/12/17 206 lb (93.4 kg)  12/10/16 207 lb 1.9 oz (93.9 kg)  12/05/16 205 lb (93 kg)    Physical Exam  Constitutional: She appears well-developed and well-nourished. No distress.  Abdominal: Soft. Normal appearance and bowel sounds are normal. She exhibits no distension and no mass. There is no hepatosplenomegaly. There is tenderness (mild) in the right lower quadrant. There is no rigidity, no rebound, no guarding, no CVA tenderness and negative Murphy's sign.  Musculoskeletal: She exhibits no edema.  Skin: Skin is warm and dry. No rash noted.  Psychiatric: She has a normal mood and affect.  Nursing note and vitals reviewed.      Assessment & Plan:   Problem List Items Addressed This Visit    Diarrhea -  Primary    Anticipate viral gastroenteritis vs food poisoning, self limited process. Pt feels improved today, stools returning to normal consistency.  Supportive care reviewed.  Update if persistent sxs past weekend and if so she will return for labwork (CBC, CMP, lipase). Pt agrees with plan.            Follow up plan: Return if symptoms worsen or fail to improve.  Ria Bush, MD

## 2017-06-12 NOTE — Patient Instructions (Addendum)
Siga probiotico  Creo que tuvo o gastroenteritis viral o food poisoning.  Siga tomando mucho liquido para mantenerse bien hidratada. Comida blanda por los proximos dias y nos deja saber si no sigue mejorando cada dia.  Intoxicacin por alimentos (Food Poisoning) QU ES LA INTOXICACIN POR ALIMENTOS? La intoxicacin por alimentos es una enfermedad que es causada por ingerir o tomar alimentos o bebidas contaminados. En la [employer], la intoxicacin es leve y dura 1 o 2 [place]. Sin embargo, algunos casos pueden ser graves, Financial risk analyst en las personas que tienen las defensas del organismo (sistema inmunitario) debilitadas, en los House, los nios y los bebs y en las mujeres [place]. QU CAUSA LA INTOXICACIN POR ALIMENTOS? Los alimentos se pueden contaminar con virus, bacterias, parsitos, hongos o productos qumicos como consecuencia de lo siguiente:  Falta de higiene personal como falta del hbito de lavarse las manos.  Almacenamiento inadecuado de los alimentos, como no refrigerar la carne cruda.  Utilizar superficies para preparar, servir o almacenar los alimentos que no estn limpias.  Cocinar o comer con utensilios sucios. Si se ingieren los alimentos contaminados, los virus, bacterias o parsitos pueden daar los intestinos. Esto causa generalmente una diarrea grave. Las causas ms frecuentes de la contaminacin de alimentos incluyen lo siguiente:  Virus como por ejemplo: ? Norovirus. ? Rotavirus.  Bacterias como: ? [place]. ? Listeria. ? E. coli (Escherichia coli).  Parsitos como: ? Giardia. ? Toxoplasmosis. CULES SON LOS SNTOMAS DE LA INTOXICACIN POR ALIMENTOS? Los sntomas pueden aparecer algunas horas despus de consumir el alimento o bebida contaminados. Algunos sntomas son los siguientes:  Nuseas.  Vmitos.  [place].  Diarrea.  Fiebre o escalofros.  Dolores musculares.  Deshidratacin. La deshidratacin puede hacer que se sienta  cansado y 45, que tenga la boca seca y que orine con menos frecuencia. CMO SE DIAGNOSTICA LA INTOXICACIN POR ALIMENTOS? El mdico puede hacer el diagnstico en funcin de su historia clnica y de un examen fsico. Esto incluye preguntarle qu es lo que ha ingerido recientemente. Tambin pueden hacerle algunos estudios, por ejemplo:  [place] de [place].  Pruebas de materia fecal. CMO SE TRATA LA INTOXICACIN POR ALIMENTOS? El tratamiento se enfoca en aliviar los sntomas y garantizar que est hidratado. Tambin pueden recetarle medicamentos. En los casos graves, podr ser necesaria la hospitalizacin y recibir lquidos por va intravenosa (IV). [place]? Comida y bebida  Beba suficiente lquido para mantener la orina clara o de color amarillo plido. Beba pequeas cantidades de lquidos claros con frecuencia.  Evite la [place], la cafena y el alcohol.  Pida instrucciones especficas a su mdico con respecto a la rehidratacin.  Haga comidas pequeas y frecuentes Medical sales representative de comidas abundantes. Medicamentos  [employer] de venta libre y los recetados solamente como se lo haya indicado el mdico. Consulte al mdico si debe seguir tomando los medicamentos recetados y de venta libre habituales.  Si le recetaron un antibitico, tmelo como se lo haya indicado el mdico. No deje de tomar los antibiticos aunque comience a Sports administrator. Instrucciones generales  Lvese bien las manos antes de preparar alimentos y despus de ir al bao (usar el inodoro). Asegrese de [employer] personas con las que convive se laven las manos con frecuencia.  Limpie todas las superficies que toca con un producto que contenga blanqueador con cloro.  Concurra a todas las visitas de control como se lo haya indicado el mdico. Esto es importante. [place]  DE LOS ALIMENTOS?   Lvese las manos, limpie bien los utensilios y las  superficies de preparacin de los alimentos antes y despus de manipular alimentos crudos.  Utilice superficies de preparacin de alimentos y espacios de almacenamiento por separado para carnes crudas, y para frutas y vegetales.  Mantenga los alimentos refrigerados por debajo de 13F (5C).  Sirva los alimentos calientes inmediatamente o mantngalos a una temperatura superior a los 113F (60C).  Almacene los alimentos secos en lugares frescos y secos, y alejados del calor o la humedad excesivos. Deseche los alimentos que no huelen bien o que estn en latas abombadas.  Siga los procedimientos aprobados para preparar conservas.  Cocine bien los alimentos en latas de conserva antes de probarlos.  Beba agua embotellada o esterilizada cuando viaje. [place]? Llame al 911 o dirjase a la sala de emergencia si:  Tiene problemas para respirar, tragar, hablar o moverse.  Tiene visin borrosa.  No puede comer ni beber sin vomitar.  Se desmaya.  Los ojos [employer].  Tiene vmitos o diarrea persistentes.  Siente dolor abdominal, este aumenta o se localiza en una zona pequea.  Tiene fiebre.  Observa sangre o mucosidad en la materia fecal, o las heces se tornan negras y de aspecto alquitranado.  Tiene signos de deshidratacin, por ejemplo: ? La [place] es [place], muy escasa o no orina. ? Los labios estn agrietados. ? No hay lgrimas cuando llora. ? Sequedad en la boca. ? Ojos hundidos. ? Somnolencia. ? Debilidad. ? Mareos. Esta informacin no tiene Marine scientist el consejo del mdico. Asegrese de hacerle al mdico cualquier pregunta que tenga. Document Released: 04/10/2008 Document Revised: 08/20/2015 Document Reviewed: 10/30/2014 Elsevier Interactive Patient Education  2018 [employer].

## 2017-06-12 NOTE — Assessment & Plan Note (Signed)
Anticipate viral gastroenteritis vs food poisoning, self limited process. Pt feels improved today, stools returning to normal consistency.  Supportive care reviewed.  Update if persistent sxs past weekend and if so she will return for labwork (CBC, CMP, lipase). Pt agrees with plan.

## 2017-07-02 ENCOUNTER — Ambulatory Visit: Payer: BLUE CROSS/BLUE SHIELD | Admitting: Dietician

## 2017-07-16 ENCOUNTER — Encounter: Payer: BLUE CROSS/BLUE SHIELD | Attending: Family Medicine | Admitting: Dietician

## 2017-07-16 DIAGNOSIS — R7301 Impaired fasting glucose: Secondary | ICD-10-CM

## 2017-07-16 DIAGNOSIS — Z6835 Body mass index (BMI) 35.0-35.9, adult: Secondary | ICD-10-CM | POA: Diagnosis not present

## 2017-07-16 DIAGNOSIS — E669 Obesity, unspecified: Secondary | ICD-10-CM | POA: Insufficient documentation

## 2017-07-16 DIAGNOSIS — Z713 Dietary counseling and surveillance: Secondary | ICD-10-CM | POA: Insufficient documentation

## 2017-07-16 NOTE — Progress Notes (Signed)
  Medical Nutrition Therapy Follow-Up:  Appt start time: 1400 end time:  1430.   Assessment:  Primary concerns today:  Follow-up for obesity. Patient has maintained her weight since last visit.  She has cut out red meat, breads and most starches from her eating routine.  She is still exercising with a coworker consistently 4x/week.  She is frustrated that she has not lost weight.  She states her eating habits are "better than ever."  She states her stress levels remain high and she is still waking up throughout the night, preventing her from getting restful sleep.  We discussed a set carbohydrate eating plan today.  She really liked this idea and wants to give it a try.  It should line up nicely with her current eating style.  Provided handouts with recommended carbohydrate intake for each meal and snack.  Provided a food log and discussed smart phone app options for tracking food intake with grams of carbohydrate consumed.  Discussed accuracy of logging portions.  Answered her nutrition questions.  Encouraged her to continue to keep stress management and improved sleep as part of her focus for her weight loss plan.  DIETARY INTAKE:  Usual eating pattern includes 3 meals and 2 snacks per day.  Avoided foods include spicy foods, tomatoes and lemons (Hx of GERD).  24-hr recall:  B (8-8:15 AM): eggs or egg whites with 1/2 avacado, coffee with powdered creamer or hot tea   Snk (10 AM): mango or pineapple or other fruit L (1 PM): smoothie with berries, spinach, kale, pineapple with chia seeds and almonds and water Snk (3-4 PM): another fruit or spoonful of peanut butter with celery D (6:00-8:00 PM): grilled meat like fish or shrimp or chicken breast, salad or veggies Snk (PM): none  Beverages: 2+ cups of coffee per day, 1 cup hot tea, 4-6 bottled waters at work and 2 bottled water at home  Usual physical activity: Gym 4-5 days a week, 1.5-2 hours rotating major muscles groups with strength training,  and 30 min on the elliptical, stationary bike or Probation officer.    Progress Towards Goal(s):  In progress.  Materials Given: Nutrition Facts Label, Yellow Card Meal Planner, Food & Carbohydrate Log  Demonstrated degree of understanding via:  Teach Back   Monitoring/Evaluation:  Dietary intake, exercise, and body weight in 2 month(s).

## 2017-08-03 ENCOUNTER — Ambulatory Visit: Payer: BLUE CROSS/BLUE SHIELD | Admitting: Family Medicine

## 2017-08-03 ENCOUNTER — Encounter: Payer: Self-pay | Admitting: Family Medicine

## 2017-08-03 VITALS — BP 136/76 | HR 104 | Temp 98.7°F | Wt 203.5 lb

## 2017-08-03 DIAGNOSIS — J069 Acute upper respiratory infection, unspecified: Secondary | ICD-10-CM | POA: Diagnosis not present

## 2017-08-03 DIAGNOSIS — B9789 Other viral agents as the cause of diseases classified elsewhere: Secondary | ICD-10-CM | POA: Diagnosis not present

## 2017-08-03 DIAGNOSIS — H6121 Impacted cerumen, right ear: Secondary | ICD-10-CM | POA: Diagnosis not present

## 2017-08-03 NOTE — Progress Notes (Signed)
Subjective:    Patient ID: Julie Vazquez, female    DOB: 1966-03-18, 52 y.o.   MRN: [id number]  HPI This is a 52 yo female who presents today with generalized aches, headache and fever x 4 days. Nasal congestion, little drainage. Clear sputum. Chest feel full. No wheeze, feels winded. Has been taking mucinex complete, tried theraflu but it upset her stomach. Subjective fever but has been on meds so no measurable fever. Rarely sick. Has felt heart pounding since taking mucinex complete (contains decongestant). Right ear feels stopped up.  Positive sick contacts with similar symptoms. Works in [employer], not around patients.   Past Medical History:  Diagnosis Date  . GERD (gastroesophageal reflux disease)   . History of peptic ulcer disease    remote  . 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 Rectal mass 08/2010   colonoscopy - submucosal bulge but no lesion (?from retroverted uterus) ([facility])  . Seasonal allergies   . SUI (stress urinary incontinence, female)    ? told has this in past.  . Vitamin D deficiency    Past Surgical History:  Procedure Laterality Date  . CESAREAN SECTION    . COLONOSCOPY  09/05/2010   nodular prominence anterior, mobile, likely compression from retoverted uterus  . MIDDLE EAR SURGERY  2000s   Family History  Problem Relation Age of Onset  . Hypertension Mother   . Diabetes Mother   . Cancer Maternal Aunt        ovarian  . Miscarriages / Stillbirths Maternal Uncle   . Diabetes Maternal Grandmother   . Coronary artery disease Maternal Grandfather        MI  . Cancer Cousin        breast  . Stroke Maternal Uncle   . Stroke Brother   . Cancer Sister        breast   Social History   Tobacco Use  . Smoking status: Never Smoker  . Smokeless tobacco: Never Used  Substance Use Topics  . Alcohol use: Yes    Alcohol/week: 0.0 oz    Comment: Social  . Drug use: No      Review of Systems Per HPI      Objective:   Physical Exam  Constitutional: She is oriented to person, place, and time. She appears well-developed and well-nourished. She appears ill. No distress.  HENT:  Head: Normocephalic and atraumatic.  Right Ear: External ear normal.  Left Ear: Tympanic membrane, external ear and ear canal normal.  Nose: Mucosal edema present. No rhinorrhea.  Mouth/Throat: Uvula is midline and mucous membranes are normal. Posterior oropharyngeal erythema present. No oropharyngeal exudate or posterior oropharyngeal edema.  Right ear canal occluded with cerumen. Non tender to exam.   Eyes: Conjunctivae are normal.  Neck: Normal range of motion. Neck supple.  Cardiovascular: Normal rate, regular rhythm and normal heart sounds.  Pulmonary/Chest: Effort normal and breath sounds normal. No respiratory distress. She has no wheezes. She has no rales.  Lymphadenopathy:    She has no cervical adenopathy.  Neurological: She is alert and oriented to person, place, and time.  Skin: Skin is warm and dry. She is not diaphoretic.  Psychiatric: She has a normal mood and affect. Her behavior is normal. Judgment and thought content normal.  Vitals reviewed.     BP 136/76   Pulse (!) 104   Temp 98.7 F (37.1 C) (Oral)   Wt 203 lb  8 oz (92.3 kg)   LMP 02/14/2015   SpO2 98%   BMI 37.22 kg/m  Wt Readings from Last 3 Encounters:  08/03/17 203 lb 8 oz (92.3 kg)  07/16/17 204 lb 11.2 oz (92.9 kg)  06/12/17 206 lb (93.4 kg)       Assessment & Plan:  1. Viral URI with cough - Provided written and verbal information regarding diagnosis and treatment. -  Patient Instructions  Good to see you today, I hope you feel better soon!  Take your allergy medicine every day and uses your nasal spray every night  Take plain mucinex (generic is fine) to help loosen your phlegm  For nasal congestion you can use saline nasal spray several times a day For cough you can try Delsym. Drink enough fluids to make your  urine light yellow. For fever/chill/muscle aches you can take over the counter acetaminophen (two extra strength) every 8 to 12 hours  Please come back in if you are not better in 5-7 days or if you develop wheezing, shortness of breath or persistent vomiting.  May use cotton ball soaked in mineral oil into ear 10-20 min per week if having recurrent ear wax accumulation. May use dilute hydrogen peroxide (equal parts this and water) and place a few drops into ear every 2 weeks to help break down wax buildup. If this does not improve hearing, please return to the office for irrigation of your ear.      2. Impacted cerumen of right ear - see above for instructions to loosen cerumen - she was instructed to RTC if no improvement   Clarene Reamer, FNP-BC  [facility], [place]  08/03/2017 4:32 PM

## 2017-08-03 NOTE — Progress Notes (Signed)
hea

## 2017-08-03 NOTE — Patient Instructions (Signed)
Good to see you today, I hope you feel better soon!  Take your allergy medicine every day and uses your nasal spray every night  Take plain mucinex (generic is fine) to help loosen your phlegm  For nasal congestion you can use saline nasal spray several times a day For cough you can try Delsym. Drink enough fluids to make your urine light yellow. For fever/chill/muscle aches you can take over the counter acetaminophen (two extra strength) every 8 to 12 hours  Please come back in if you are not better in 5-7 days or if you develop wheezing, shortness of breath or persistent vomiting.  May use cotton ball soaked in mineral oil into ear 10-20 min per week if having recurrent ear wax accumulation. May use dilute hydrogen peroxide (equal parts this and water) and place a few drops into ear every 2 weeks to help break down wax buildup. If this does not improve hearing, please return to the office for irrigation of your ear.

## 2017-09-17 ENCOUNTER — Encounter: Payer: Self-pay | Admitting: Dietician

## 2017-09-17 ENCOUNTER — Encounter: Payer: BLUE CROSS/BLUE SHIELD | Attending: Family Medicine | Admitting: Dietician

## 2017-09-17 DIAGNOSIS — Z6835 Body mass index (BMI) 35.0-35.9, adult: Secondary | ICD-10-CM | POA: Diagnosis not present

## 2017-09-17 DIAGNOSIS — R7301 Impaired fasting glucose: Secondary | ICD-10-CM

## 2017-09-17 DIAGNOSIS — Z713 Dietary counseling and surveillance: Secondary | ICD-10-CM | POA: Diagnosis not present

## 2017-09-17 DIAGNOSIS — E669 Obesity, unspecified: Secondary | ICD-10-CM | POA: Diagnosis not present

## 2017-09-17 NOTE — Progress Notes (Signed)
  Medical Nutrition Therapy Follow-Up:  Appt start time: 1630 end time:  1700.  Assessment:  Primary concerns today:  Follow-up for obesity.  Reports today with a 3 lb weight loss over the last 2 months.  She states that she can tell a difference in the way her clothes are fitting and has likely lost more inches than pounds as she has stayed very consistent with her exercise routine.  She is doing a 12 week boot camp class with her sister at the [facility] that includes a fit test and body measurements.  This will be great information for her to measure her progress other than the scale alone.  She is using her food journal to record food intake and has really liked using the carbohydrate materials we reviewed at our last visit.  She states that she is managing her stress with exercise and has seen small improvements to her sleep lately.  She states that she is very motivated to continue with her carbohydrate eating plan as well as food journaling.  Recommended she spend a little time each week reviewing her food journal and taking notes.  She states that she has also been focused on limiting her sodium intake recently.  Updated her dietary intake and reviewed her set goals.  Provided additional materials for her eating plan.  DIETARY INTAKE:  Usual eating pattern includes 3 meals and 2 snacks per day.  Avoided foods include spicy foods, tomatoes and lemons (Hx of GERD).  Limits red meat and breads.  24-hr recall:  B (8-8:15 AM): eggs or egg whites with 1/2 avacado, coffee with powdered creamer or hot tea with cinnamon, ginger, and turmeric Snk (10 AM): mango, banana, pineapple or other fruit L (1 PM): smoothie with berries, spinach, kale, pineapple with chia seeds and almonds and water OR lettuce with chicken breasts, peppers, onions, and cauliflower rice Snk (3-4 PM): another fruit or spoonful of peanut butter with celery or crackers D (6:00-8:00 PM): grilled meat like fish or shrimp or chicken breast,  salad or veggies Snk (PM): none  Beverages: 2+ cups of coffee per day, 1 cup hot tea, 4-6 bottled waters at work and 2 bottled water at home   Usual physical activity: Continues to exercise 4-5 days per week.  Gym 2-3 days a week, 1.5 hours rotating major muscles groups with strength training, and 30 min on the elliptical, stationary bike or stair master.  Started two weeks ago Tues and Thurs doing a boot camp class at the [facility] for 1 hour that will last for 12 weeks.  Progress Towards Goal(s):  In progress.  Materials Given: Smoothies with Balance (Carb-Pro), Sample Meal Plans for Women (30 g Carb)   Demonstrated degree of understanding via:  Teach Back   Monitoring/Evaluation:  Dietary intake, exercise, and body weight in 3 month(s).

## 2017-10-16 ENCOUNTER — Encounter: Payer: Self-pay | Admitting: Family Medicine

## 2017-10-16 ENCOUNTER — Ambulatory Visit: Payer: BLUE CROSS/BLUE SHIELD | Admitting: Family Medicine

## 2017-10-16 VITALS — BP 120/70 | HR 102 | Temp 99.8°F | Ht 62.0 in | Wt 194.5 lb

## 2017-10-16 DIAGNOSIS — H6121 Impacted cerumen, right ear: Secondary | ICD-10-CM | POA: Diagnosis not present

## 2017-10-16 DIAGNOSIS — H6120 Impacted cerumen, unspecified ear: Secondary | ICD-10-CM | POA: Insufficient documentation

## 2017-10-16 DIAGNOSIS — H65191 Other acute nonsuppurative otitis media, right ear: Secondary | ICD-10-CM | POA: Diagnosis not present

## 2017-10-16 DIAGNOSIS — J209 Acute bronchitis, unspecified: Secondary | ICD-10-CM

## 2017-10-16 MED ORDER — AZITHROMYCIN 250 MG PO TABS: ORAL_TABLET | ORAL | 0 refills | Status: DC

## 2017-10-16 NOTE — Assessment & Plan Note (Signed)
Disimpaction performed and pt tolerated well.

## 2017-10-16 NOTE — Assessment & Plan Note (Signed)
Rx azithromycin to help cover atypical bronchitis.  Update if not improving with treatment.

## 2017-10-16 NOTE — Progress Notes (Signed)
BP 120/70 (BP Location: Left Arm, Patient Position: Sitting, Cuff Size: Large)   Pulse (!) 102   Temp 99.8 F (37.7 C) (Oral)   Ht 5\' 2"  (1.575 m)   Wt 194 lb 8 oz (88.2 kg)   LMP 02/14/2015   SpO2 94%   BMI 35.57 kg/m    CC: cough Subjective:    Patient ID: Julie Vazquez, female    DOB: 1965-09-10, 52 y.o.   MRN: [id number]  HPI: Julie Vazquez is a 52 y.o. female presenting on 10/16/2017 for Cough (Productive cough, feverish and chills. Started 09/11/17. Seen in March for same sxs in March. Has tried Delsym and plain Musinex as suggested before, not helping. Also, pt has developed a rash. )   5 d h/o productive cough with low grade fevers, chills, R earache, hoarse voice. New rash started 3 days ago. Some dyspnea and wheezing. Fatigue. Chest > head congestion.   Similar sxs 07/2017.   Tried OTC delsym, mucinex.  Son sick at home.  No h/o asthma.  Non smoker.   Relevant past medical, surgical, family and social history reviewed and updated as indicated. Interim medical history since our last visit reviewed. Allergies and medications reviewed and updated. Outpatient Medications Prior to Visit  Medication Sig Dispense Refill  . cholecalciferol 2000 units tablet Take 1 tablet (2,000 Units total) by mouth daily.    . diclofenac sodium (VOLTAREN) 1 % GEL Apply 1 application topically 3 (three) times daily. 1 Tube 1  . fluticasone (FLONASE) 50 MCG/ACT nasal spray Place 1 spray into both nostrils daily. Daily as needed    . Multiple Vitamins-Minerals (MULTIVITAMIN PO) Take by mouth.      . NONFORMULARY OR COMPOUNDED ITEM [facility]:  Onychomycosis nail lacquer - Fluconazole 2%, Terbinafine 1%, DMSO, apply to affected area daily. 120 each 2  . Probiotic Product (PROBIOTIC DAILY PO) Take 1 tablet by mouth daily. Reported on 11/27/2015    . Scar Treatment Products (RECEDO) GEL Apply topically. For keloid    . triamcinolone cream (KENALOG) 0.1 % Apply 1 application topically 2 (two)  times daily. Apply to AA. 30 g 0   No facility-administered medications prior to visit.      Per HPI unless specifically indicated in ROS section below Review of Systems     Objective:    BP 120/70 (BP Location: Left Arm, Patient Position: Sitting, Cuff Size: Large)   Pulse (!) 102   Temp 99.8 F (37.7 C) (Oral)   Ht 5\' 2"  (1.575 m)   Wt 194 lb 8 oz (88.2 kg)   LMP 02/14/2015   SpO2 94%   BMI 35.57 kg/m   Wt Readings from Last 3 Encounters:  10/16/17 194 lb 8 oz (88.2 kg)  09/17/17 202 lb (91.6 kg)  08/03/17 203 lb 8 oz (92.3 kg)    Physical Exam  Constitutional: She appears well-developed and well-nourished. No distress.  HENT:  Head: Normocephalic and atraumatic.  Right Ear: Hearing and external ear normal.  Left Ear: Hearing, tympanic membrane, external ear and ear canal normal.  Nose: No mucosal edema or rhinorrhea. Right sinus exhibits no maxillary sinus tenderness and no frontal sinus tenderness. Left sinus exhibits no maxillary sinus tenderness and no frontal sinus tenderness.  Mouth/Throat: Uvula is midline, oropharynx is clear and moist and mucous membranes are normal. No oropharyngeal exudate, posterior oropharyngeal edema, posterior oropharyngeal erythema or tonsillar abscesses.  Hard piece of wax impacting R ear and covering TM - removed with plastic curette  and alligator forceps, pt tolerated well.  Afterwards, R TM fully visualized - retracted with effusion behind TM  Eyes: Pupils are equal, round, and reactive to light. Conjunctivae and EOM are normal. No scleral icterus.  Neck: Normal range of motion. Neck supple.  Cardiovascular: Normal rate, regular rhythm, normal heart sounds and intact distal pulses.  No murmur heard. Pulmonary/Chest: Effort normal and breath sounds normal. No respiratory distress. She has no wheezes. She has no rales.  Bibasilar crackles Coarse cough present  Lymphadenopathy:    She has no cervical adenopathy.  Skin: Skin is warm and  dry. No rash noted.  Nursing note and vitals reviewed.  Results for orders placed or performed in visit on 12/05/16  Lipid panel  Result Value Ref Range   Cholesterol 171 0 - 200 mg/dL   Triglycerides 74.0 0.0 - 149.0 mg/dL   HDL 94.20 >39.00 mg/dL   VLDL 14.8 0.0 - 40.0 mg/dL   LDL Cholesterol 62 0 - 99 mg/dL   Total CHOL/HDL Ratio 2    NonHDL 77.18   Comprehensive metabolic panel  Result Value Ref Range   Sodium 137 135 - 145 mEq/L   Potassium 4.5 3.5 - 5.1 mEq/L   Chloride 101 96 - 112 mEq/L   CO2 28 19 - 32 mEq/L   Glucose, Bld 105 (H) 70 - 99 mg/dL   BUN 20 6 - 23 mg/dL   Creatinine, Ser 0.77 0.40 - 1.20 mg/dL   Total Bilirubin 0.4 0.2 - 1.2 mg/dL   Alkaline Phosphatase 86 39 - 117 U/L   AST 18 0 - 37 U/L   ALT 18 0 - 35 U/L   Total Protein 8.0 6.0 - 8.3 g/dL   Albumin 4.0 3.5 - 5.2 g/dL   Calcium 9.8 8.4 - 10.5 mg/dL   GFR 83.59 >60.00 mL/min  VITAMIN D 25 Hydroxy (Vit-D Deficiency, Fractures)  Result Value Ref Range   VITD 27.90 (L) 30.00 - 100.00 ng/mL      Assessment & Plan:   Problem List Items Addressed This Visit    Acute otitis media with effusion of right ear - Primary    Rx azithromycin to help cover atypical bronchitis.  Update if not improving with treatment.       Relevant Medications   azithromycin (ZITHROMAX) 250 MG tablet   Cerumen impaction    Disimpaction performed and pt tolerated well.        Other Visit Diagnoses    Acute bronchitis, unspecified organism           Meds ordered this encounter  Medications  . azithromycin (ZITHROMAX) 250 MG tablet    Sig: Take two tablets on day one followed by one tablet on days 2-5    Dispense:  6 each    Refill:  0   No orders of the defined types were placed in this encounter.   Follow up plan: Return if symptoms worsen or fail to improve.  Ria Bush, MD

## 2017-10-16 NOTE — Patient Instructions (Signed)
You have bronchitis and right ear infection  Treat with zpack antibiotic. Continue delsym cough syrup.  Push fluids and rest. Let us know if fever >101 or worsening productive cough.  Ear cleaned today.

## 2017-12-17 ENCOUNTER — Encounter: Payer: BLUE CROSS/BLUE SHIELD | Attending: Family Medicine | Admitting: Dietician

## 2017-12-17 ENCOUNTER — Telehealth: Payer: Self-pay | Admitting: Family Medicine

## 2017-12-17 ENCOUNTER — Encounter: Payer: Self-pay | Admitting: Dietician

## 2017-12-17 DIAGNOSIS — E669 Obesity, unspecified: Secondary | ICD-10-CM

## 2017-12-17 DIAGNOSIS — Z6832 Body mass index (BMI) 32.0-32.9, adult: Secondary | ICD-10-CM

## 2017-12-17 DIAGNOSIS — Z6835 Body mass index (BMI) 35.0-35.9, adult: Secondary | ICD-10-CM | POA: Diagnosis not present

## 2017-12-17 DIAGNOSIS — Z713 Dietary counseling and surveillance: Secondary | ICD-10-CM | POA: Diagnosis not present

## 2017-12-17 DIAGNOSIS — E6609 Other obesity due to excess calories: Secondary | ICD-10-CM

## 2017-12-17 NOTE — Telephone Encounter (Signed)
Pt last seen annual 12/10/16 and acute visit on 10/16/17; no future appt scheduled.Please advise.

## 2017-12-17 NOTE — Telephone Encounter (Signed)
Copied from [facility] [id number]. Topic: Referral - Request >> Dec 17, 2017  4:04 PM Gardiner Ramus wrote: Reason for CRM: Melissa calling from [facility] would like an updated referral on pt to continue to see them. Cb#[phone]

## 2017-12-17 NOTE — Progress Notes (Signed)
  Medical Nutrition Therapy Follow-Up:  Appt start time: 1600 end time:  1630.  Assessment:  Primary concerns today:  Follow-up for obesity.  She has been working out consistently and following a good eating plan.  She states that her most recent weigh-in at the gym was 179 lbs for a weight loss of 15 lbs over the last 3 months.  Her goal weight is 140-145 lbs.  She took a 2 week vacation to visit [place] and did not regain any weight.  She states that it was a great stress reliever too and now that she is back home she continues to manage her stress with exercise. She and her daughter have been working out together and have increased the intensity of their workouts.  She reports sleeping very well with the increased activity which makes her tired.  She states that she is very motivated to continue with her carbohydrate eating plan and watching her sodium intake.  Updated her dietary intake and reviewed her set goals.  Answered her nutrition questions.  DIETARY INTAKE:   Usual eating pattern includes 3 meals and 2 snacks per day.  Avoided foods include spicy foods, tomatoes and lemons (Hx of GERD).  Limits red meat and breads.  24-hr recall:  B (8-8:15 AM): eggs or egg whites with 1/2 avacado, coffee with powdered creamer or hot tea with cinnamon, ginger, and turmeric Snk (10 AM): mango, banana, pineapple or other fruit L (1 PM): lentil soup with green beans, salad or chicken breasts, peppers, onions, and cauliflower rice Snk (3-4 PM): another fruit or spoonful of peanut butter with celery or crackers D (6:00-8:00 PM): smoothie with papaya, protein powder, spinach, kale, and almond milk Snk (PM): none  Beverages: 2+ cups of coffee per day, 1 cup hot tea, 4-6 bottled waters at work and 2 bottled water at home   Usual physical activity: Continues to exercise 4-5 days per week at the [facility].  Boot camp for 1 hour with personal trainer 2-3x/week, spinning class for 45 min.  1x/week, Gym 2-3x/week for walking and weight lifting routine given by her personal trainer.  Progress Towards Goal(s):  In progress.  Demonstrated degree of understanding via:  Teach Back   Monitoring/Evaluation:  Dietary intake, exercise, and body weight in 6 month(s).

## 2017-12-18 NOTE — Telephone Encounter (Signed)
Referral placed.

## 2017-12-18 NOTE — Telephone Encounter (Signed)
Placed New NDM referral on the [facility] and left message on machine for Melissa.

## 2018-06-24 ENCOUNTER — Ambulatory Visit: Payer: BLUE CROSS/BLUE SHIELD | Admitting: Dietician

## 2019-01-12 DIAGNOSIS — E66813 Obesity, class 3: Secondary | ICD-10-CM | POA: Insufficient documentation

## 2019-01-12 DIAGNOSIS — Z6841 Body Mass Index (BMI) 40.0 and over, adult: Secondary | ICD-10-CM | POA: Insufficient documentation

## 2019-01-12 DIAGNOSIS — R030 Elevated blood-pressure reading, without diagnosis of hypertension: Secondary | ICD-10-CM | POA: Insufficient documentation

## 2019-03-04 ENCOUNTER — Other Ambulatory Visit: Payer: Self-pay

## 2019-03-04 DIAGNOSIS — Z20822 Contact with and (suspected) exposure to covid-19: Secondary | ICD-10-CM

## 2019-03-06 LAB — NOVEL CORONAVIRUS, NAA: SARS-CoV-2, NAA: NOT DETECTED

## 2019-05-03 ENCOUNTER — Ambulatory Visit: Payer: Self-pay | Admitting: *Deleted

## 2019-05-03 NOTE — Telephone Encounter (Signed)
I was asked by Julie Vazquez to triage this pt.   She is c/o have diarrhea, headache and feeling weak with some mild abd pain.  See triage notes.  I warm transferred the call into [facility] to [place] to be scheduled.  I sent my notes to the office.  Reason for Disposition . [1] MODERATE diarrhea (e.g., 4-6 times / day more than normal) AND [2] present > 48 hours (2 days)    Questionable COVID-19  Answer Assessment - Initial Assessment Questions 1. DIARRHEA SEVERITY: "How bad is the diarrhea?" "How many extra stools have you had in the past 24 hours than normal?"    - NO DIARRHEA (SCALE 0)   - MILD (SCALE 1-3): Few loose or mushy BMs; increase of 1-3 stools over normal daily number of stools; mild increase in ostomy output.   -  MODERATE (SCALE 4-7): Increase of 4-6 stools daily over normal; moderate increase in ostomy output. * SEVERE (SCALE 8-10; OR 'WORST POSSIBLE'): Increase of 7 or more stools daily over normal; moderate increase in ostomy output; incontinence.     Yesterday I had diarrhea and fever and headache.   I wasn't eating or drinking.   I'm coughing some. 2. ONSET: "When did the diarrhea begin?"      Yesterday    Still having it today.    I had a fever last night of 101. 3. BM CONSISTENCY: "How loose or watery is the diarrhea?"      She went to a clinic in [place].    I need to find out if I have COVID.  The clinic said it was "a thin line that I may have COVID or not" 4. VOMITING: "Are you also vomiting?" If so, ask: "How many times in the past 24 hours?"      nauseaed 5. ABDOMINAL PAIN: "Are you having any abdominal pain?" If yes: "What does it feel like?" (e.g., crampy, dull, intermittent, constant)      Yes yesterday I had pain. 6. ABDOMINAL PAIN SEVERITY: If present, ask: "How bad is the pain?"  (e.g., Scale 1-10; mild, moderate, or severe)   - MILD (1-3): doesn't interfere with normal activities, abdomen soft and not tender to touch    - MODERATE (4-7):  interferes with normal activities or awakens from sleep, tender to touch    - SEVERE (8-10): excruciating pain, doubled over, unable to do any normal activities       Mild 7. ORAL INTAKE: If vomiting, "Have you been able to drink liquids?" "How much fluids have you had in the past 24 hours?"     Not eating or drinking much 8. HYDRATION: "Any signs of dehydration?" (e.g., dry mouth [not just dry lips], too weak to stand, dizziness, new weight loss) "When did you last urinate?"     I get dizzy when I get up sometimes and my mouth is dry.   I'm drinking Pedialytle and Gator Aid. 9. EXPOSURE: "Have you traveled to a foreign country recently?" "Have you been exposed to anyone with diarrhea?" "Could you have eaten any food that was spoiled?"     No.   I stayed out of work yesterday and today. 10. ANTIBIOTIC USE: "Are you taking antibiotics now or have you taken antibiotics in the past 2 months?"       No 11. OTHER SYMPTOMS: "Do you have any other symptoms?" (e.g., fever, blood in stool)       Last night fever 101.6  I have not checked it today 12. PREGNANCY: "Is there any chance you are pregnant?" "When was your last menstrual period?"       N/A due to age  Protocols used: DIARRHEA-A-AH

## 2019-05-03 NOTE — Telephone Encounter (Signed)
Pt already has a virtual appt with Dr Lorelei Pont 05/04/19 at 8:40.

## 2019-05-04 ENCOUNTER — Encounter: Payer: Self-pay | Admitting: Family Medicine

## 2019-05-04 ENCOUNTER — Ambulatory Visit (INDEPENDENT_AMBULATORY_CARE_PROVIDER_SITE_OTHER): Payer: BLUE CROSS/BLUE SHIELD | Admitting: Family Medicine

## 2019-05-04 ENCOUNTER — Other Ambulatory Visit: Payer: Self-pay

## 2019-05-04 DIAGNOSIS — R05 Cough: Secondary | ICD-10-CM

## 2019-05-04 DIAGNOSIS — R197 Diarrhea, unspecified: Secondary | ICD-10-CM | POA: Diagnosis not present

## 2019-05-04 DIAGNOSIS — R059 Cough, unspecified: Secondary | ICD-10-CM

## 2019-05-04 MED ORDER — DIPHENOXYLATE-ATROPINE 2.5-0.025 MG PO TABS: 1.0000 | ORAL_TABLET | Freq: Four times a day (QID) | ORAL | 0 refills | Status: DC | PRN

## 2019-05-04 NOTE — Progress Notes (Signed)
Work note written and mailed to patient as instructed by Dr.Copland.  I also sent it to her MyChart since it may not reach her by mail before next Tuesday.

## 2019-05-04 NOTE — Progress Notes (Signed)
Julie Buxton T. Julie Wellnitz, MD Primary Care and Sports Medicine [facility] [place], [address] Phone: [phone]  FAX: [phone]  Julie Vazquez - 53 y.o. female  MRN [id number]  Date of Birth: [birth year]  Visit Date: 05/04/2019  PCP: Ria Bush, MD  Referred by: Ria Bush, MD No chief complaint on file.  Virtual Visit via Video Note:  I connected with  Julie Vazquez on 05/04/2019  8:40 AM EST by a video enabled telemedicine application and verified that I am speaking with the correct person using two identifiers.   Location patient: home computer, tablet, or smartphone Location provider: work or home office Consent: Verbal consent directly obtained from [place] Persons participating in the virtual visit: patient, provider  I discussed the limitations of evaluation and management by telemedicine and the availability of in person appointments. The patient expressed understanding and agreed to proceed.  Interactive audio and video telecommunications were attempted between this provider and patient, however failed, due to patient having technical difficulties OR patient did not have access to video capability.  We continued and completed visit with audio only.     History of Present Illness:  Diarrhea, 3rd day.  everytime drink.  n and no v.  Fever for 2 days. 97.6 today\ Coughing but not much. St, ha.  Art therapist. covid yrsterday. Went to urgent care and she reports that there was an equivocal test for COVID-19.  Review of Systems as above: See pertinent positives and pertinent negatives per HPI No acute distress verbally  Past Medical History, Surgical History, Social History, Family History, Problem List, Medications, and Allergies have been reviewed and updated if relevant.   Observations/Objective/Exam:  An attempt was made to discern vital signs over the phone and per patient if  applicable and possible.   General:    Alert, Oriented, appears well and in no acute distress HEENT:     Atraumatic, conjunctiva clear, no obvious abnormalities on inspection of external nose and ears.  Neck:    Normal movements of the head and neck Pulmonary:     On inspection no signs of respiratory distress, breathing rate appears normal, no obvious gross SOB, gasping or wheezing Cardiovascular:    No obvious cyanosis Musculoskeletal:    Moves all visible extremities without noticeable abnormality Psych / Neurological:     Pleasant and cooperative, no obvious depression or anxiety, speech and thought processing grossly intact  Assessment and Plan:    ICD-10-CM   1. Diarrhea, unspecified type  R19.7   2. Cough  R05    >10 minutes spent in face to face time with patient, >50% spent in counselling or coordination of care  More likely GI virus, but certainly has multiple symptoms where this could be COVID-19.  She has had testing which is reportedly negative or equivocal, but given potential risk and her work as a Art therapist, I recommended that she quarantine for the next 7 days, with a initial onset of symptoms 3 days ago.  I discussed the assessment and treatment plan with the patient. The patient was provided an opportunity to ask questions and all were answered. The patient agreed with the plan and demonstrated an understanding of the instructions.   The patient was advised to call back or seek an in-person evaluation if the symptoms worsen or if the condition fails to improve as anticipated.  Follow-up: prn unless noted otherwise below No follow-ups on file.  Meds ordered this  encounter  Medications  . diphenoxylate-atropine (LOMOTIL) 2.5-0.025 MG tablet    Sig: Take 1 tablet by mouth 4 (four) times daily as needed for diarrhea or loose stools.    Dispense:  30 tablet    Refill:  0   No orders of the defined types were placed in this  encounter.   Signed,  Maud Deed. [name], MD

## 2019-05-20 ENCOUNTER — Ambulatory Visit (INDEPENDENT_AMBULATORY_CARE_PROVIDER_SITE_OTHER): Payer: 59 | Admitting: Family Medicine

## 2019-05-20 ENCOUNTER — Other Ambulatory Visit: Payer: Self-pay

## 2019-05-20 ENCOUNTER — Encounter: Payer: Self-pay | Admitting: Family Medicine

## 2019-05-20 VITALS — BP 118/74 | HR 90 | Temp 97.8°F | Ht 62.0 in | Wt 212.1 lb

## 2019-05-20 DIAGNOSIS — K219 Gastro-esophageal reflux disease without esophagitis: Secondary | ICD-10-CM

## 2019-05-20 DIAGNOSIS — Z8711 Personal history of peptic ulcer disease: Secondary | ICD-10-CM | POA: Diagnosis not present

## 2019-05-20 DIAGNOSIS — R1011 Right upper quadrant pain: Secondary | ICD-10-CM

## 2019-05-20 DIAGNOSIS — Z20822 Contact with and (suspected) exposure to covid-19: Secondary | ICD-10-CM | POA: Diagnosis not present

## 2019-05-20 LAB — COMPREHENSIVE METABOLIC PANEL
ALT: 25 U/L (ref 0–35)
AST: 22 U/L (ref 0–37)
Albumin: 4.3 g/dL (ref 3.5–5.2)
Alkaline Phosphatase: 89 U/L (ref 39–117)
BUN: 15 mg/dL (ref 6–23)
CO2: 30 mEq/L (ref 19–32)
Calcium: 9.9 mg/dL (ref 8.4–10.5)
Chloride: 99 mEq/L (ref 96–112)
Creatinine, Ser: 0.66 mg/dL (ref 0.40–1.20)
GFR: 93.08 mL/min (ref >60.00)
Glucose, Bld: 95 mg/dL (ref 70–99)
Potassium: 4.1 mEq/L (ref 3.5–5.1)
Sodium: 135 mEq/L (ref 135–145)
Total Bilirubin: 0.3 mg/dL (ref 0.2–1.2)
Total Protein: 8.3 g/dL (ref 6.0–8.3)

## 2019-05-20 LAB — CBC WITH DIFFERENTIAL/PLATELET
Basophils Absolute: 0.1 10*3/uL (ref 0.0–0.1)
Basophils Relative: 0.7 % (ref 0.0–3.0)
Eosinophils Absolute: 0.1 10*3/uL (ref 0.0–0.7)
Eosinophils Relative: 1.1 % (ref 0.0–5.0)
HCT: 38.6 % (ref 36.0–46.0)
Hemoglobin: 12.5 g/dL (ref 12.0–15.0)
Lymphocytes Relative: 39.2 % (ref 12.0–46.0)
Lymphs Abs: 4.2 10*3/uL — ABNORMAL HIGH (ref 0.7–4.0)
MCHC: 32.3 g/dL (ref 30.0–36.0)
MCV: 89.9 fl (ref 78.0–100.0)
Monocytes Absolute: 0.7 10*3/uL (ref 0.1–1.0)
Monocytes Relative: 6.4 % (ref 3.0–12.0)
Neutro Abs: 5.7 10*3/uL (ref 1.4–7.7)
Neutrophils Relative %: 52.6 % (ref 43.0–77.0)
Platelets: 326 10*3/uL (ref 150.0–400.0)
RBC: 4.29 Mil/uL (ref 3.87–5.11)
RDW: 13.8 % (ref 11.5–15.5)
WBC: 10.8 10*3/uL — ABNORMAL HIGH (ref 4.0–10.5)

## 2019-05-20 LAB — LIPASE: Lipase: 46 U/L (ref 11.0–59.0)

## 2019-05-20 MED ORDER — FAMOTIDINE 20 MG PO TABS: 20.0000 mg | ORAL_TABLET | Freq: Every day | ORAL | Status: DC

## 2019-05-20 NOTE — Progress Notes (Signed)
This visit was conducted in person.  BP 118/74 (BP Location: Left Arm, Patient Position: Sitting, Cuff Size: Large)   Pulse 90   Temp 97.8 F (36.6 C) (Temporal)   Ht 5\' 2"  (1.575 m)   Wt 212 lb 2 oz (96.2 kg)   LMP 02/14/2015   SpO2 97%   BMI 38.80 kg/m    CC: GI discomfort Subjective:    Patient ID: Julie Vazquez, female    DOB: Mar 21, 1966, 54 y.o.   MRN: [id number]  HPI: Julie Vazquez is a 54 y.o. female presenting on 05/20/2019 for GI Problem (C/o abd discomfort after every meal, especially on RUQ.  Also, had N/V/D.  Tested a few times for COVID results have been negative.  Latest test on 05/07/19, results inconclusive.  )   Seen [facility] PCP since last seen here due to change in insurance. Since then has changed insurance and we are back in network.   ?equivocal test for Covid 05/03/2019 (at some outside lab). Tested negative for Covid with [facility] 05/09/2019. Febrile GI illness with chills for 3 days starting 05/02/2019 then nausea/vomiting, diarrhea. Minimal cough. Abd pain/cramping after every meal, improved after BM. No blood in stool. Overall feeling better with resolution of abd pain/cramping but with persistent GI upset/malaise. Needing to eat small portion sizes.   Now with persistent GI upset predominant at RUQ, worse with eating or drinking. No blood in stool. No diarrhea/constipation. Already avoids greasy foods.   Hasn't tried anything for this yet besides omeprazole 20mg  OTC PRN. However finds trouble tolerating omeprazole due to ?HA. GERD symptoms with greasy spicy foods.  Has not been taking probiotic.   Never loss of taste or smell. Never dyspnea during GI illness.      Relevant past medical, surgical, family and social history reviewed and updated as indicated. Interim medical history since our last visit reviewed. Allergies and medications reviewed and updated. Outpatient Medications Prior to Visit  Medication Sig Dispense Refill  . diclofenac sodium (VOLTAREN)  1 % GEL Apply 1 application topically 3 (three) times daily. 1 Tube 1  . fluticasone (FLONASE) 50 MCG/ACT nasal spray Place 1 spray into both nostrils daily. Daily as needed    . NONFORMULARY OR COMPOUNDED ITEM [facility]:  Onychomycosis nail lacquer - Fluconazole 2%, Terbinafine 1%, DMSO, apply to affected area daily. 120 each 2  . Probiotic Product (PROBIOTIC DAILY PO) Take 1 tablet by mouth daily. Reported on 11/27/2015    . Scar Treatment Products (RECEDO) GEL Apply topically. For keloid    . triamcinolone cream (KENALOG) 0.1 % Apply 1 application topically 2 (two) times daily. Apply to AA. 30 g 0  . diphenoxylate-atropine (LOMOTIL) 2.5-0.025 MG tablet Take 1 tablet by mouth 4 (four) times daily as needed for diarrhea or loose stools. 30 tablet 0  . Multiple Vitamins-Minerals (MULTIVITAMIN PO) Take by mouth.      Marland Kitchen azithromycin (ZITHROMAX) 250 MG tablet Take two tablets on day one followed by one tablet on days 2-5 6 each 0  . cholecalciferol 2000 units tablet Take 1 tablet (2,000 Units total) by mouth daily.     No facility-administered medications prior to visit.     Per HPI unless specifically indicated in ROS section below Review of Systems Objective:    BP 118/74 (BP Location: Left Arm, Patient Position: Sitting, Cuff Size: Large)   Pulse 90   Temp 97.8 F (36.6 C) (Temporal)   Ht 5\' 2"  (1.575 m)   Wt 212 lb 2 oz (  96.2 kg)   LMP 02/14/2015   SpO2 97%   BMI 38.80 kg/m   Wt Readings from Last 3 Encounters:  05/20/19 212 lb 2 oz (96.2 kg)  12/17/17 179 lb (81.2 kg)  10/16/17 194 lb 8 oz (88.2 kg)    Physical Exam Vitals and nursing note reviewed.  Constitutional:      Appearance: Normal appearance. She is obese.  HENT:     Head: Normocephalic and atraumatic.  Eyes:     Extraocular Movements: Extraocular movements intact.     Pupils: Pupils are equal, round, and reactive to light.  Cardiovascular:     Rate and Rhythm: Normal rate and regular rhythm.     Pulses:  Normal pulses.     Heart sounds: Normal heart sounds. No murmur.  Pulmonary:     Effort: Pulmonary effort is normal. No respiratory distress.     Breath sounds: Normal breath sounds. No wheezing, rhonchi or rales.  Abdominal:     General: Abdomen is flat. Bowel sounds are normal. There is no distension.     Palpations: Abdomen is soft. There is no mass.     Tenderness: There is abdominal tenderness (mild-mod) in the right upper quadrant, epigastric area and left upper quadrant. There is no right CVA tenderness, left CVA tenderness, guarding or rebound. Negative signs include Murphy's sign.     Hernia: No hernia is present.  Musculoskeletal:     Right lower leg: No edema.     Left lower leg: No edema.  Neurological:     Mental Status: She is alert.  Psychiatric:        Mood and Affect: Mood normal.        Behavior: Behavior normal.       Results for orders placed or performed in visit on 05/20/19  Comprehensive metabolic panel  Result Value Ref Range   Sodium 135 135 - 145 mEq/L   Potassium 4.1 3.5 - 5.1 mEq/L   Chloride 99 96 - 112 mEq/L   CO2 30 19 - 32 mEq/L   Glucose, Bld 95 70 - 99 mg/dL   BUN 15 6 - 23 mg/dL   Creatinine, Ser 0.66 0.40 - 1.20 mg/dL   Total Bilirubin 0.3 0.2 - 1.2 mg/dL   Alkaline Phosphatase 89 39 - 117 U/L   AST 22 0 - 37 U/L   ALT 25 0 - 35 U/L   Total Protein 8.3 6.0 - 8.3 g/dL   Albumin 4.3 3.5 - 5.2 g/dL   GFR 93.08 >60.00 mL/min   Calcium 9.9 8.4 - 10.5 mg/dL  CBC with Differential  Result Value Ref Range   WBC 10.8 (H) 4.0 - 10.5 K/uL   RBC 4.29 3.87 - 5.11 Mil/uL   Hemoglobin 12.5 12.0 - 15.0 g/dL   HCT 38.6 36.0 - 46.0 %   MCV 89.9 78.0 - 100.0 fl   MCHC 32.3 30.0 - 36.0 g/dL   RDW 13.8 11.5 - 15.5 %   Platelets 326.0 150.0 - 400.0 K/uL   Neutrophils Relative % 52.6 43.0 - 77.0 %   Lymphocytes Relative 39.2 12.0 - 46.0 %   Monocytes Relative 6.4 3.0 - 12.0 %   Eosinophils Relative 1.1 0.0 - 5.0 %   Basophils Relative 0.7 0.0 - 3.0 %     Neutro Abs 5.7 1.4 - 7.7 K/uL   Lymphs Abs 4.2 (H) 0.7 - 4.0 K/uL   Monocytes Absolute 0.7 0.1 - 1.0 K/uL   Eosinophils Absolute 0.1 0.0 -  0.7 K/uL   Basophils Absolute 0.1 0.0 - 0.1 K/uL  Lipase  Result Value Ref Range   Lipase 46.0 11.0 - 59.0 U/L   Assessment & Plan:  This visit occurred during the SARS-CoV-2 public health emergency.  Safety protocols were in place, including screening questions prior to the visit, additional usage of staff PPE, and extensive cleaning of exam room while observing appropriate contact time as indicated for disinfecting solutions.   Problem List Items Addressed This Visit    RUQ abdominal pain - Primary    Ongoing GI upset, malaise, RUQ discomfort since GI illness 2+ wks ago. ?IBS post viral gastroenteritis, r/o GERD, PUD, gallstones. Will check labwork, RUQ [place] - consider H pylori testing. Will start pepcid nightly hopefully better tolerated than PPI. Also advised start daily probiotic to rplenish normal gut flora after gastroenteritis. Update with effect. Pt agrees with plan.       Relevant Orders   Comprehensive metabolic panel (Completed)   CBC with Differential (Completed)   US Abdomen Limited RUQ   Lipase (Completed)   Person under investigation for COVID-19    Recent GI illness doubt covid related - requests serology - will check IgM (illness was 2+ wks ago).       Relevant Orders   SARS-CoV-2 Antibody, IgM   History of peptic ulcer disease    Remote       GERD (gastroesophageal reflux disease)    Start pepcid. Trouble tolerating omeprazole.       Relevant Medications   famotidine (PEPCID) 20 MG tablet       Meds ordered this encounter  Medications  . famotidine (PEPCID) 20 MG tablet    Sig: Take 1 tablet (20 mg total) by mouth at bedtime.   Orders Placed This Encounter  Procedures  . US Abdomen Limited RUQ    Wt. 212 Ins. [employer] No need [facility] w [facility] no interpreter needed  No covid test  & No Travel / Pt aware of  Mask / Come Alone    Standing Status:   Future    Standing Expiration Date:   07/17/2020    Order Specific Question:   Reason for Exam (SYMPTOM  OR DIAGNOSIS REQUIRED)    Answer:   RUQ abd pain    Order Specific Question:   Preferred imaging location?    Answer:   [facility]  . Comprehensive metabolic panel  . CBC with Differential  . Lipase  . SARS-CoV-2 Antibody, IgM    Order Specific Question:   Is this test for diagnosis or screening    Answer:   Diagnosis of ill patient    Order Specific Question:   Symptomatic for COVID-19 as defined by CDC    Answer:   Yes    Order Specific Question:   Date of Symptom Onset    Answer:   05/02/2019    Order Specific Question:   Hospitalized for COVID-19    Answer:   No    Order Specific Question:   Admitted to ICU for COVID-19    Answer:   No    Order Specific Question:   Previously tested for COVID-19    Answer:   Yes    Order Specific Question:   Resident in a congregate (group) care setting    Answer:   No    Order Specific Question:   Employed in healthcare setting    Answer:   Yes    Order Specific Question:   Pregnant  Answer:   No    Patient Instructions  De pronto tiene irritacion estomacal despues de infeccion el mes pasado. Tome pepcid 20mg  de noche sin receta para acidez.  Empieze probiotico diario - [facility] o align or culturelle (sin receta) prueba de sangre hoy voy a revisar sonograma de higado y vesicula biliar.    Follow up plan: No follow-ups on file.  Ria Bush, MD

## 2019-05-20 NOTE — Patient Instructions (Addendum)
De pronto tiene irritacion estomacal despues de infeccion el mes pasado. Tome pepcid 20mg  de noche sin receta para acidez.  Empieze probiotico diario - [facility] o align or culturelle (sin receta) prueba de sangre hoy voy a revisar sonograma de higado y vesicula biliar.

## 2019-05-21 DIAGNOSIS — Z20822 Contact with and (suspected) exposure to covid-19: Secondary | ICD-10-CM | POA: Insufficient documentation

## 2019-05-21 NOTE — Assessment & Plan Note (Signed)
Remote  

## 2019-05-21 NOTE — Assessment & Plan Note (Signed)
Start pepcid. Trouble tolerating omeprazole.

## 2019-05-21 NOTE — Assessment & Plan Note (Addendum)
Ongoing GI upset, malaise, RUQ discomfort since GI illness 2+ wks ago. ?IBS post viral gastroenteritis, r/o GERD, PUD, gallstones. Will check labwork, RUQ [place] - consider H pylori testing. Will start pepcid nightly hopefully better tolerated than PPI. Also advised start daily probiotic to rplenish normal gut flora after gastroenteritis. Update with effect. Pt agrees with plan.

## 2019-05-21 NOTE — Assessment & Plan Note (Signed)
Recent GI illness doubt covid related - requests serology - will check IgM (illness was 2+ wks ago).

## 2019-05-22 LAB — SARS-COV-2 ANTIBODY, IGM: SARS-CoV-2 Antibody, IgM: NEGATIVE

## 2019-05-23 ENCOUNTER — Ambulatory Visit
Admission: RE | Admit: 2019-05-23 | Discharge: 2019-05-23 | Disposition: A | Discharge status: Home / Self Care | Payer: 59 | Source: Ambulatory Visit | Attending: Family Medicine | Admitting: Family Medicine

## 2019-05-23 DIAGNOSIS — R1011 Right upper quadrant pain: Secondary | ICD-10-CM

## 2019-07-13 ENCOUNTER — Encounter: Payer: BLUE CROSS/BLUE SHIELD | Admitting: Family Medicine

## 2019-07-19 ENCOUNTER — Other Ambulatory Visit: Payer: Self-pay

## 2019-07-19 ENCOUNTER — Ambulatory Visit (INDEPENDENT_AMBULATORY_CARE_PROVIDER_SITE_OTHER): Payer: 59 | Admitting: Family Medicine

## 2019-07-19 ENCOUNTER — Encounter: Payer: Self-pay | Admitting: Family Medicine

## 2019-07-19 VITALS — BP 118/70 | HR 91 | Temp 97.8°F | Ht 60.5 in | Wt 205.2 lb

## 2019-07-19 DIAGNOSIS — R1011 Right upper quadrant pain: Secondary | ICD-10-CM | POA: Diagnosis not present

## 2019-07-19 DIAGNOSIS — L910 Hypertrophic scar: Secondary | ICD-10-CM

## 2019-07-19 DIAGNOSIS — H579 Unspecified disorder of eye and adnexa: Secondary | ICD-10-CM | POA: Insufficient documentation

## 2019-07-19 DIAGNOSIS — Z20822 Contact with and (suspected) exposure to covid-19: Secondary | ICD-10-CM

## 2019-07-19 DIAGNOSIS — E559 Vitamin D deficiency, unspecified: Secondary | ICD-10-CM

## 2019-07-19 DIAGNOSIS — Z0000 Encounter for general adult medical examination without abnormal findings: Secondary | ICD-10-CM | POA: Diagnosis not present

## 2019-07-19 DIAGNOSIS — E669 Obesity, unspecified: Secondary | ICD-10-CM

## 2019-07-19 MED ORDER — DICLOFENAC SODIUM 1 % EX GEL: 2.0000 g | Freq: Four times a day (QID) | CUTANEOUS | 3 refills | Status: DC

## 2019-07-19 NOTE — Assessment & Plan Note (Addendum)
Left medial eye with pigmented macule - advised she schedule eye doctor appointment for further evaluation as she feels it's enlarging.

## 2019-07-19 NOTE — Progress Notes (Signed)
This visit was conducted in person.  BP 118/70 (BP Location: Left Arm, Patient Position: Sitting, Cuff Size: Large)   Pulse 91   Temp 97.8 F (36.6 C) (Temporal)   Ht 5' 0.5" (1.537 m)   Wt 205 lb 3 oz (93.1 kg)   LMP 02/14/2015   SpO2 96%   BMI 39.41 kg/m    CC: CPE Subjective:    Patient ID: Julie Vazquez, female    DOB: 06/22/1965, 54 y.o.   MRN: [id number]  HPI: Julie Vazquez is a 54 y.o. female presenting on 07/19/2019 for Annual Exam   She thinks she had Covid 05/2019 - states she tested positive with serology.   Ongoing RUQ discomfort after eating. RUQ [place] 05/2019 WNL.  Enlarging L shoulder keloid, tender, itchy, bothersome.  Feels L medial eye pigmented lesion is growing   Preventative: COLONOSCOPY Date: 09/05/2010 nodular prominence anterior, mobile, likely compression from retoverted uterus  Well woman exam yearly with OBGYN (Dr Orvan Seen), normal pap smears last 2018. H/o fibroids, she is following her for this. Due for f/u.  Mammogram - with OBGYN 2018. Due for rpt.  LMP - years ago Flu shot - declines  Td 2007  Covid vaccine - upcoming Friday  Seat belt use discussed.  Sunscreen use discussed. No changing moles on skin.  Non smoker Alcohol - seldom  [place] Caffeine: 1-2 cups coffee/day Lives with husband and 2 children Occupation: Copywriter, advertising Activity: 3-5x/wk cardio and weights (Physiological scientist) - restarted regular exercise routine   Diet: fruits/vegetables daily, good water, red meat occasional, 2x/wk fish, avoids sugar and carbs      Relevant past medical, surgical, family and social history reviewed and updated as indicated. Interim medical history since our last visit reviewed. Allergies and medications reviewed and updated. Outpatient Medications Prior to Visit  Medication Sig Dispense Refill  . famotidine (PEPCID) 20 MG tablet Take 1 tablet (20 mg total) by mouth at bedtime.    . fluticasone (FLONASE) 50 MCG/ACT nasal spray Place 1 spray  into both nostrils daily. Daily as needed    . Multiple Vitamins-Minerals (MULTIVITAMIN PO) Take by mouth.      . Probiotic Product (PROBIOTIC DAILY PO) Take 1 tablet by mouth daily. Reported on 11/27/2015    . triamcinolone cream (KENALOG) 0.1 % Apply 1 application topically 2 (two) times daily. Apply to AA. 30 g 0  . diclofenac Sodium (VOLTAREN) 1 % GEL Apply topically 3 (three) times daily. As needed    . diclofenac sodium (VOLTAREN) 1 % GEL Apply 1 application topically 3 (three) times daily. 1 Tube 1  . diclofenac Sodium (VOLTAREN) 1 % GEL Apply topically in the morning, at noon, and at bedtime. As needed    . NONFORMULARY OR COMPOUNDED ITEM [facility]:  Onychomycosis nail lacquer - Fluconazole 2%, Terbinafine 1%, DMSO, apply to affected area daily. 120 each 2  . Scar Treatment Products (RECEDO) GEL Apply topically. For keloid     No facility-administered medications prior to visit.     Per HPI unless specifically indicated in ROS section below Review of Systems  Constitutional: Negative for activity change, appetite change, chills, fatigue, fever and unexpected weight change.  HENT: Negative for hearing loss.   Eyes: Negative for visual disturbance.  Respiratory: Negative for cough, chest tightness, shortness of breath and wheezing.   Cardiovascular: Positive for leg swelling (occasional). Negative for chest pain and palpitations.  Gastrointestinal: Positive for abdominal pain (intermittent RUQ) and nausea. Negative for abdominal distention, blood in  stool, constipation, diarrhea and vomiting.  Genitourinary: Negative for difficulty urinating and hematuria.  Musculoskeletal: Negative for arthralgias, myalgias and neck pain.  Skin: Negative for rash.  Neurological: Negative for dizziness, seizures, syncope and headaches.  Hematological: Negative for adenopathy. Does not bruise/bleed easily.  Psychiatric/Behavioral: Negative for dysphoric mood. The patient is not nervous/anxious.     Objective:    BP 118/70 (BP Location: Left Arm, Patient Position: Sitting, Cuff Size: Large)   Pulse 91   Temp 97.8 F (36.6 C) (Temporal)   Ht 5' 0.5" (1.537 m)   Wt 205 lb 3 oz (93.1 kg)   LMP 02/14/2015   SpO2 96%   BMI 39.41 kg/m   Wt Readings from Last 3 Encounters:  07/19/19 205 lb 3 oz (93.1 kg)  05/20/19 212 lb 2 oz (96.2 kg)  12/17/17 179 lb (81.2 kg)    Physical Exam Vitals and nursing note reviewed.  Constitutional:      General: She is not in acute distress.    Appearance: Normal appearance. She is well-developed. She is obese. She is not ill-appearing.  HENT:     Head: Normocephalic and atraumatic.     Right Ear: Hearing, tympanic membrane, ear canal and external ear normal.     Left Ear: Hearing, tympanic membrane, ear canal and external ear normal.     Mouth/Throat:     Pharynx: Uvula midline.  Eyes:     General: No scleral icterus.    Conjunctiva/sclera: Conjunctivae normal.     Pupils: Pupils are equal, round, and reactive to light.  Cardiovascular:     Rate and Rhythm: Normal rate and regular rhythm.     Pulses: Normal pulses.          Radial pulses are 2+ on the right side and 2+ on the left side.     Heart sounds: Normal heart sounds. No murmur.  Pulmonary:     Effort: Pulmonary effort is normal. No respiratory distress.     Breath sounds: Normal breath sounds. No wheezing, rhonchi or rales.  Abdominal:     General: Abdomen is flat. Bowel sounds are normal. There is no distension.     Palpations: Abdomen is soft. There is no mass.     Tenderness: There is no abdominal tenderness. There is no guarding or rebound.     Hernia: No hernia is present.  Musculoskeletal:        General: Normal range of motion.     Cervical back: Normal range of motion and neck supple.     Right lower leg: No edema.     Left lower leg: No edema.  Lymphadenopathy:     Cervical: No cervical adenopathy.  Skin:    General: Skin is warm and dry.     Findings: No rash.    Neurological:     General: No focal deficit present.     Mental Status: She is alert and oriented to person, place, and time.     Comments: CN grossly intact, station and gait intact  Psychiatric:        Mood and Affect: Mood normal.        Behavior: Behavior normal.        Thought Content: Thought content normal.        Judgment: Judgment normal.       Results for orders placed or performed in visit on 05/20/19  Comprehensive metabolic panel  Result Value Ref Range   Sodium 135 135 - 145 mEq/L  Potassium 4.1 3.5 - 5.1 mEq/L   Chloride 99 96 - 112 mEq/L   CO2 30 19 - 32 mEq/L   Glucose, Bld 95 70 - 99 mg/dL   BUN 15 6 - 23 mg/dL   Creatinine, Ser 0.66 0.40 - 1.20 mg/dL   Total Bilirubin 0.3 0.2 - 1.2 mg/dL   Alkaline Phosphatase 89 39 - 117 U/L   AST 22 0 - 37 U/L   ALT 25 0 - 35 U/L   Total Protein 8.3 6.0 - 8.3 g/dL   Albumin 4.3 3.5 - 5.2 g/dL   GFR 93.08 >60.00 mL/min   Calcium 9.9 8.4 - 10.5 mg/dL  CBC with Differential  Result Value Ref Range   WBC 10.8 (H) 4.0 - 10.5 K/uL   RBC 4.29 3.87 - 5.11 Mil/uL   Hemoglobin 12.5 12.0 - 15.0 g/dL   HCT 38.6 36.0 - 46.0 %   MCV 89.9 78.0 - 100.0 fl   MCHC 32.3 30.0 - 36.0 g/dL   RDW 13.8 11.5 - 15.5 %   Platelets 326.0 150.0 - 400.0 K/uL   Neutrophils Relative % 52.6 43.0 - 77.0 %   Lymphocytes Relative 39.2 12.0 - 46.0 %   Monocytes Relative 6.4 3.0 - 12.0 %   Eosinophils Relative 1.1 0.0 - 5.0 %   Basophils Relative 0.7 0.0 - 3.0 %   Neutro Abs 5.7 1.4 - 7.7 K/uL   Lymphs Abs 4.2 (H) 0.7 - 4.0 K/uL   Monocytes Absolute 0.7 0.1 - 1.0 K/uL   Eosinophils Absolute 0.1 0.0 - 0.7 K/uL   Basophils Absolute 0.1 0.0 - 0.1 K/uL  Lipase  Result Value Ref Range   Lipase 46.0 11.0 - 59.0 U/L  SARS-CoV-2 Antibody, IgM  Result Value Ref Range   SARS-CoV-2 Antibody, IgM Negative Negative   Assessment & Plan:  This visit occurred during the SARS-CoV-2 public health emergency.  Safety protocols were in place, including  screening questions prior to the visit, additional usage of staff PPE, and extensive cleaning of exam room while observing appropriate contact time as indicated for disinfecting solutions.   Problem List Items Addressed This Visit    Vitamin D deficiency    Update levels.  Not currently taking replacement. Encouraged she restart 1000 IU daily.       Relevant Orders   VITAMIN D 25 Hydroxy (Vit-D Deficiency, Fractures)   RUQ abdominal pain    Continued RUQ discomfort worse with greasy meals, bitter taste in mouth, suspicious for gallbladder dysfunction. RUQ [place] unrevealing. Will order HIDA scan.       Relevant Orders   NM Hepato W/Eject Fract   Person under investigation for COVID-19    Recent predominantly GI illness but with related loss of taste and smell - was not consistent with covid 19 infection - but she states she had positive serology. Has scheduled upcoming covid vaccine this week.       Obesity, Class II, BMI 35-39.9, no comorbidity    Congratulated on weight loss.  Motivated to continue working with nutritionist virtually.       Keloid    Refer back to derm due to persitesnt discomfort. Last saw derm 2017      Relevant Orders   Ambulatory referral to Dermatology   Healthcare maintenance - Primary    Preventative protocols reviewed and updated unless pt declined. Discussed healthy diet and lifestyle.       Eye lesion    Left medial eye with pigmented macule - advised  she schedule eye doctor appointment for further evaluation as she feels it's enlarging.           Meds ordered this encounter  Medications  . diclofenac Sodium (VOLTAREN) 1 % GEL    Sig: Apply 2 g topically 4 (four) times daily. As needed    Dispense:  100 g    Refill:  3   Orders Placed This Encounter  Procedures  . NM Hepato W/Eject Fract    Standing Status:   Future    Standing Expiration Date:   09/17/2020    Order Specific Question:   ** REASON FOR EXAM (FREE TEXT)    Answer:   RUQ abd  pain    Order Specific Question:   If indicated for the ordered procedure, I authorize the administration of a radiopharmaceutical per Radiology protocol    Answer:   Yes    Order Specific Question:   Is the patient pregnant?    Answer:   No    Order Specific Question:   Preferred imaging location?    Answer:   [facility]    Order Specific Question:   Radiology Contrast Protocol - do NOT remove file path    Answer:   [email or web address]  . VITAMIN D 25 Hydroxy (Vit-D Deficiency, Fractures)  . Ambulatory referral to Dermatology    Referral Priority:   Routine    Referral Type:   Consultation    Referral Reason:   Specialty Services Required    Requested Specialty:   Dermatology    Number of Visits Requested:   1    Patient instructions: Llame para cita de mamograma y con ginecologo.  La mandare a dermatologo para evaluar el keloid.  Haga cita con doctor de ojos.  Revisaremos vitamina D hoy.   Follow up plan: Return in about 1 year (around 07/18/2020), or if symptoms worsen or fail to improve, for annual exam, prior fasting for blood work.  Ria Bush, MD

## 2019-07-19 NOTE — Patient Instructions (Addendum)
Llame para cita de mamograma y con ginecologo.  La mandare a dermatologo para evaluar el keloid.  Haga cita con doctor de ojos.  Revisaremos vitamina D hoy.   [place] Maintenance for Postmenopausal Women La menopausia es un proceso normal en el cual la capacidad de quedar embarazada llega a su fin. Este proceso ocurre lentamente a lo largo de un perodo de muchos meses o aos; por lo general, entre los 69 y los 74aos. La menopausia es completa cuando no se ha tenido el perodo menstrual por 40meses. Es importante hablar con el mdico sobre algunas de las enfermedades ms comunes que afectan a las mujeres despus de la menopausia (mujeres posmenopusicas). Estas incluyen la enfermedad cardaca, el cncer y la prdida sea (osteoporosis). Adoptar un estilo de vida saludable y recibir atencin preventiva pueden ayudar a promover la salud y Musician. Las medidas que tome tambin pueden reducir las probabilidades de Actor algunas de estas enfermedades frecuentes. Qu debo saber acerca de la menopausia? Durante la menopausia, puede tener una serie de sntomas, por ejemplo:  Acaloramiento. Estos pueden ser moderados o intensos.  Sudoracin nocturna.  Disminucin del deseo sexual.  Cambios en el estado de nimo.  Dolores de [place].  Cansancio.  Irritabilidad.  Problemas de memoria.  Insomnio. Tratar o no estos sntomas es una decisin que se toma con el mdico. Necesito terapia de reemplazo hormonal?  La terapia de reemplazo hormonal es eficaz para tratar los sntomas causados por la menopausia, como los acaloramientos y las sudoraciones nocturnas.  La reposicin hormonal conlleva ciertos riesgos, especialmente a medida que una mujer envejece. Si est pensando en usar estrgeno o estrgeno con progestina, analice los beneficios y los riesgos con el mdico. Cul es mi riesgo de sufrir enfermedad cardaca y accidente  cerebrovascular? A medida que se envejece, aumenta el riesgo de enfermedad cardaca, infarto de miocardio y accidente cerebrovascular. Una de las causas puede ser un cambio en las hormonas del cuerpo durante la menopausia. Esto puede afectar la forma en que el organismo procesa las [place], los triglicridos y el colesterol de su dieta. El infarto de miocardio y el accidente cerebrovascular son emergencias mdicas. Hay muchas cosas que se pueden hacer para ayudar a prevenir la enfermedad cardaca y el accidente cerebrovascular. Contrlese la presin arterial  La hipertensin arterial causa enfermedades cardacas y [place] el riesgo de accidente cerebrovascular. Es ms probable que esto se manifieste en las personas que tienen lecturas de presin arterial alta, tienen ascendencia africana o tienen sobrepeso.  Hgase controlar la presin arterial: ? Cada 3 a 5 aos si tiene entre 18 y 59 aos. ? Todos los aos si es mayor de [place]. Consuma una dieta saludable   Consuma una dieta que incluya muchas verduras, frutas, productos lcteos con bajo contenido de [place] y Advertising account planner.  No consuma muchos alimentos ricos en grasas slidas, azcares agregados o sodio. Haga ejercicio con regularidad Haga ejercicio con regularidad. Esta es una de las prcticas ms importantes que puede hacer por su salud. La mayora de los adultos deben seguir estas pautas:  Intente realizar al menos 156minutos de actividad fsica por semana. El ejercicio debe aumentar la frecuencia cardaca y Nature conservation officer transpirar (ejercicio de intensidad moderada).  Intente hacer ejercicios de elongacin por lo menos dos veces por semana. Agrguelos al plan de ejercicio de intensidad moderada.  Pasar menos tiempo sentados. Incluso la actividad fsica ligera puede ser beneficiosa. Otros consejos  Trabaje con su mdico para alcanzar  o mantener un peso saludable.  No consuma ningn producto que contenga nicotina o tabaco, como  cigarrillos, cigarrillos electrnicos y tabaco de Higher education careers adviser. Si necesita ayuda para dejar de fumar, consulte al mdico.  Conozca sus cifras. Pdale al mdico que le controle el colesterol y el nivel sanguneo de azcar en la sangre (glucosa). Siga hacindose anlisis de [employer] se lo haya indicado el mdico. Necesito realizarme pruebas de deteccin del cncer? Segn su historia clnica y sus antecedentes familiares, es posible que deba realizarse pruebas de deteccin del cncer en diferentes etapas de la vida. Esto puede incluir pruebas de deteccin de lo siguiente:  Cncer de mama.  Cncer de cuello uterino.  Cncer de pulmn.  Cncer colorrectal. Cul es mi riesgo de tener osteoporosis? Despus de la menopausia, puede correr un riesgo ms alto de tener osteoporosis. La osteoporosis es una afeccin en la cual la destruccin de la masa sea ocurre con mayor rapidez que su formacin. Para ayudar a prevenir esta afeccin o las fracturas seas que pueden ocurrir a causa de [place], usted puede tomar las siguientes medidas:  Si tiene entre 19 y 50aos, tome como mnimo 1000mg  de calcio y 600mg  de vitaminaD por Training and development officer.  Si es mayor de 50aos pero menor de 70aos, tome como mnimo 1200mg  de calcio y 600mg  de vitaminaD por Training and development officer.  Si es mayor de 70aos, tome como mnimo 1200mg  de calcio y 800mg  de vitaminaD por Training and development officer. Fumar y beber alcohol en exceso aumentan el riesgo de osteoporosis. Consuma alimentos ricos en calcio y vitaminaD, y haga ejercicios con soporte de peso varias veces a la semana, como se lo haya indicado el mdico. De qu manera la menopausia afecta mi salud mental? La depresin puede presentarse a cualquier edad, pero es ms frecuente a medida que una persona envejece. Los sntomas comunes de depresin incluyen lo siguiente:  Desnimo o tristeza.  Cambios en los patrones de sueo.  Cambios en el apetito o en los hbitos de alimentacin.  Sensacin de falta general de  motivacin o placer al [employer] actividades que sola disfrutar.  Crisis frecuentes de llanto. Hable con el mdico si cree que tiene depresin. Instrucciones generales Visite a su mdico para hacerse exmenes de bienestar peridicos y aplicarse vacunas. Puede incluir:  Programar exmenes peridicos dentales, de la salud y de Public librarian.  Recibir y [employer]. Estos incluyen los siguientes: ? Human resources officer. Aplquese esta vacuna todos los aos antes de que comience la temporada de gripe. ? Vacuna contra la neumona. ? Vacuna contra el herpes. ? Vacuna contra el ttanos, la difteria y la tos Dyann Ruddle (Tdap). El mdico tambin puede recomendarle que se aplique otras vacunas. Notifique a su mdico si alguna vez ha sido vctima de abuso o si no se siente seguro en su hogar. Resumen  La menopausia es un proceso normal en el cual la capacidad de quedar embarazada llega a su fin.  Esta condicin causa acaloramientos, sudoraciones nocturnas, disminucin del inters en el sexo, cambios en el estado de nimo, dolores de [place] o falta de sueo.  El tratamiento de esta afeccin puede incluir una terapia de reemplazo hormonal.  Tome medidas para mantenerse 38, entre ellas, hacer ejercicio con regularidad, seguir una dieta saludable, controlar su peso y medirse la presin arterial y los niveles de Dispensing optician.  Hgase pruebas para Film/video editor y depresin. Asegrese de estar al da con todas las vacunas. Esta informacin no tiene Marine scientist el consejo del  mdico. Asegrese de hacerle al mdico cualquier pregunta que tenga. Document Revised: 05/19/2018 Document Reviewed: 05/19/2018 Elsevier Patient Education  [employer].

## 2019-07-19 NOTE — Assessment & Plan Note (Signed)
Update levels.  Not currently taking replacement. Encouraged she restart 1000 IU daily.

## 2019-07-19 NOTE — Assessment & Plan Note (Signed)
Refer back to derm due to persitesnt discomfort. Last saw derm 2017

## 2019-07-19 NOTE — Assessment & Plan Note (Addendum)
Congratulated on weight loss.  Motivated to continue working with nutritionist virtually.

## 2019-07-19 NOTE — Assessment & Plan Note (Signed)
Preventative protocols reviewed and updated unless pt declined. Discussed healthy diet and lifestyle.  

## 2019-07-19 NOTE — Assessment & Plan Note (Signed)
Recent predominantly GI illness but with related loss of taste and smell - was not consistent with covid 19 infection - but she states she had positive serology. Has scheduled upcoming covid vaccine this week.

## 2019-07-19 NOTE — Assessment & Plan Note (Signed)
Continued RUQ discomfort worse with greasy meals, bitter taste in mouth, suspicious for gallbladder dysfunction. RUQ [place] unrevealing. Will order HIDA scan.

## 2019-07-20 LAB — VITAMIN D 25 HYDROXY (VIT D DEFICIENCY, FRACTURES): VITD: 29.62 ng/mL — ABNORMAL LOW (ref 30.00–100.00)

## 2019-08-23 ENCOUNTER — Ambulatory Visit (HOSPITAL_COMMUNITY): Payer: 59

## 2019-08-26 ENCOUNTER — Other Ambulatory Visit: Payer: Self-pay

## 2019-08-26 ENCOUNTER — Ambulatory Visit (HOSPITAL_COMMUNITY)
Admission: RE | Admit: 2019-08-26 | Discharge: 2019-08-26 | Disposition: A | Discharge status: Home / Self Care | Payer: 59 | Source: Ambulatory Visit | Attending: Family Medicine | Admitting: Family Medicine

## 2019-08-26 DIAGNOSIS — R1011 Right upper quadrant pain: Secondary | ICD-10-CM | POA: Diagnosis present

## 2019-08-26 MED ORDER — TECHNETIUM TC 99M MEBROFENIN IV KIT
5.4000 | PACK | Freq: Once | INTRAVENOUS | Status: AC | PRN
  Administered 2019-08-26: 5.4 via INTRAVENOUS

## 2019-08-30 ENCOUNTER — Telehealth: Payer: Self-pay

## 2019-08-30 ENCOUNTER — Other Ambulatory Visit: Payer: Self-pay | Admitting: Family Medicine

## 2019-08-30 DIAGNOSIS — K828 Other specified diseases of gallbladder: Secondary | ICD-10-CM

## 2019-08-30 NOTE — Telephone Encounter (Signed)
See other message

## 2019-08-30 NOTE — Telephone Encounter (Signed)
Left message for patient to call back. Need to make sure patient saw HIDA scan results and discuss location preference for general surgeon referral that was placed by Dr Darnell Level

## 2019-08-30 NOTE — Telephone Encounter (Signed)
Read pt MyChart results from Dr. Danise Mina.  Pt understands and agrees.  She would like to see a Education officer, environmental in [place].

## 2019-08-31 NOTE — Telephone Encounter (Addendum)
Lvm asking pt to call back.  Need to relay results [see Imaging, 08/26/19] and Dr. Synthia Innocent message.

## 2019-08-31 NOTE — Telephone Encounter (Signed)
Pt returning call.  States she had been given the message but has a few questions.  She requests a call from Dr. Darnell Level when he gets a chance.  FYI to Dr. Darnell Level.

## 2019-08-31 NOTE — Telephone Encounter (Addendum)
Called, no answer.

## 2019-08-31 NOTE — Telephone Encounter (Signed)
Spoke with patient , questions answered

## 2019-09-27 ENCOUNTER — Other Ambulatory Visit: Payer: Self-pay | Admitting: Obstetrics and Gynecology

## 2019-09-27 DIAGNOSIS — R928 Other abnormal and inconclusive findings on diagnostic imaging of breast: Secondary | ICD-10-CM

## 2019-10-06 ENCOUNTER — Ambulatory Visit
Admission: RE | Admit: 2019-10-06 | Discharge: 2019-10-06 | Disposition: A | Discharge status: Home / Self Care | Payer: 59 | Source: Ambulatory Visit | Attending: Obstetrics and Gynecology | Admitting: Obstetrics and Gynecology

## 2019-10-06 ENCOUNTER — Other Ambulatory Visit: Payer: Self-pay

## 2019-10-06 DIAGNOSIS — R928 Other abnormal and inconclusive findings on diagnostic imaging of breast: Secondary | ICD-10-CM

## 2019-10-19 ENCOUNTER — Ambulatory Visit (INDEPENDENT_AMBULATORY_CARE_PROVIDER_SITE_OTHER): Payer: 59 | Admitting: Dermatology

## 2019-10-19 ENCOUNTER — Other Ambulatory Visit: Payer: Self-pay

## 2019-10-19 DIAGNOSIS — L820 Inflamed seborrheic keratosis: Secondary | ICD-10-CM | POA: Diagnosis not present

## 2019-10-19 DIAGNOSIS — L910 Hypertrophic scar: Secondary | ICD-10-CM

## 2019-10-19 NOTE — Progress Notes (Signed)
   New Patient Visit  Subjective  Julie Vazquez is a 54 y.o. female who presents for the following: keloid (L upper back - has been there for years but is now very painful, itchy, stings, and growing larger. She is very concerned and would like to discuss treatment options. She has tried ILK and TMC 0.1% cream but didn't notice an improvement in symptoms).  The following portions of the chart were reviewed this encounter and updated as appropriate:  Tobacco  Allergies  Meds  Problems  Med Hx  Surg Hx  Fam Hx     Review of Systems:  No other skin or systemic complaints except as noted in HPI or Assessment and Plan.  Objective  Well appearing patient in no apparent distress; mood and affect are within normal limits.  A focused examination was performed including the back. Relevant physical exam findings are noted in the Assessment and Plan.  Objective  Left Upper Back: Keloid plaque 6.0 x 2.0 cm   Images    Objective  Left Breast: Erythematous keratotic or waxy stuck-on papule or plaque.    Assessment & Plan  Keloid with growth and symptoms Left Upper Back  Recommend and discussed excision followed by radiation treatment with Dr. Baruch Gouty at the cancer center. Because the keloid is symptomatic, the patient would like to pursue this. Advised that even if we do all this there is still potential for recurrence. Patient understands  Other Related Procedures Ambulatory referral to Radiation Oncology  Inflamed seborrheic keratosis Left Breast  Destruction of lesion - Left Breast Complexity: simple   Destruction method: cryotherapy   Informed consent: discussed and consent obtained   Timeout:  patient name, date of birth, surgical site, and procedure verified Lesion destroyed using liquid nitrogen: Yes   Region frozen until ice ball extended beyond lesion: Yes   Outcome: patient tolerated procedure well with no complications   Post-procedure details: wound care  instructions given    Return for surgery - keloid.  Luther Redo, CMA, am acting as scribe for Sarina Ser, MD .  Documentation: I have reviewed the above documentation for accuracy and completeness, and I agree with the above.  Sarina Ser, MD

## 2019-10-24 ENCOUNTER — Ambulatory Visit: Payer: Self-pay | Admitting: Surgery

## 2019-10-24 NOTE — H&P (Signed)
Julie Vazquez Appointment: 10/24/2019 2:30 PM Location: [facility] Patient #: [id number] DOB: Sep 20, 1965 Married / Language: Undefined / Race: Refused to Report/Unreported Female  History of Present Illness Adin Hector MD; 10/24/2019 6:20 PM) The patient is a 54 year old female who presents with abdominal pain. Note for "Abdominal pain": ` ` ` Patient sent for surgical consultation at the request of Ria Bush  Chief Complaint: Abdominal pain. Probably her dyskinesia. ` ` The patient is a wound his had recurrent right upper quadrant pain. Usually triggered by eating greasy foods. She is try to adjust her diet but still has some attacks. Had episode of severe epigastric pain with loose bowel movements. Concerned her. Workup revealed no gallstones. Sent for a gallbladder HIDA study with an ejection fraction which was 11%. Some discomfort afterwards but no severe recurrent attack. Concern of possible heartburn and reflux. Placed on Pepcid. Seem to help with heartburns but still getting attacks. Surgical consultation offered.  She comes today by herself. From [place] area. Then [place]. More recently in [place]. Has a history of C-section in the 1990s. Apparently had some hypertrophic scarring but was managed easily. She has a keloid-like scar in her left upper back. Seen hematology. Discussion about doing excision with radiation therapy. She had a colonoscopy a while ago. She believes was done [facility]. Underwhelming. Moves her bowels about 2-3 times a day. She can walk at least an hour without difficulty. She does not smoke. She rarely drinks any alcohol. No diabetes. Some seasonal allergies. No personal nor family history of GI/colon cancer, inflammatory bowel disease, irritable bowel syndrome, allergy such as Celiac Sprue, dietary/dairy problems, colitis, ulcers nor gastritis. No recent sick contacts/gastroenteritis. No travel  outside the country. No changes in diet. No dysphagia to solids or liquids. No significant heartburn or reflux. No melena, hematemesis, coffee ground emesis. No evidence of prior gastric/peptic ulceration.  (Review of systems as stated in this history (HPI) or in the review of systems. Otherwise all other 12 point ROS are negative) ` ` `  This patient encounter took 30 minutes today to perform the following: obtain history, perform exam, review outside records, interpret tests & imaging, counsel the patient on their diagnosis; and, document this encounter, including findings & plan in the electronic health record (EHR).   Allergies (Chanel Teressa Senter, CMA; 10/24/2019 2:45 PM) Lavella Lemons *COUGH/COLD/ALLERGY* Allergies Reconciled  Medication History (Chanel Teressa Senter, CMA; 10/24/2019 2:45 PM) ZyrTEC (Oral) Specific strength unknown - Active. Medications Reconciled    Vitals (Chanel Nolan CMA; 10/24/2019 2:46 PM) 10/24/2019 2:45 PM Weight: 209.38 lb Height: 62in Body Surface Area: 1.95 m Body Mass Index: 38.29 kg/m  Temp.: 97.60F  Pulse: 120 (Regular)  BP: 120/74(Sitting, Left Arm, Standard)        Physical Exam Adin Hector MD; 10/24/2019 3:22 PM)  General Mental Status-Alert. General Appearance-Not in acute distress, Not Sickly. Orientation-Oriented X3. Hydration-Well hydrated. Voice-Normal.  Integumentary Global Assessment Upon inspection and palpation of skin surfaces of the - Axillae: non-tender, no inflammation or ulceration, no drainage. and Distribution of scalp and body hair is normal. General Characteristics Temperature - normal warmth is noted.  Head and Neck Head-normocephalic, atraumatic with no lesions or palpable masses. Face Global Assessment - atraumatic, no absence of expression. Neck Global Assessment - no abnormal movements, no bruit auscultated on the right, no bruit auscultated on the left, no decreased range of motion,  non-tender. Trachea-midline. Thyroid Gland Characteristics - non-tender.  Eye Eyeball - Left-Extraocular movements intact, No Nystagmus -  Left. Eyeball - Right-Extraocular movements intact, No Nystagmus - Right. Cornea - Left-No Hazy - Left. Cornea - Right-No Hazy - Right. Sclera/Conjunctiva - Left-No scleral icterus, No Discharge - Left. Sclera/Conjunctiva - Right-No scleral icterus, No Discharge - Right. Pupil - Left-Direct reaction to light normal. Pupil - Right-Direct reaction to light normal.  ENMT Ears Pinna - Left - no drainage observed, no generalized tenderness observed. Pinna - Right - no drainage observed, no generalized tenderness observed. Nose and Sinuses External Inspection of the Nose - no destructive lesion observed. Inspection of the nares - Left - quiet respiration. Inspection of the nares - Right - quiet respiration. Mouth and Throat Lips - Upper Lip - no fissures observed, no pallor noted. Lower Lip - no fissures observed, no pallor noted. Nasopharynx - no discharge present. Oral Cavity/Oropharynx - Tongue - no dryness observed. Oral Mucosa - no cyanosis observed. Hypopharynx - no evidence of airway distress observed.  Chest and Lung Exam Inspection Movements - Normal and Symmetrical. Accessory muscles - No use of accessory muscles in breathing. Palpation Palpation of the chest reveals - Non-tender. Auscultation Breath sounds - Normal and Clear.  Cardiovascular Auscultation Rhythm - Regular. Murmurs & Other Heart Sounds - Auscultation of the heart reveals - No Murmurs and No Systolic Clicks.  Abdomen Inspection Inspection of the abdomen reveals - No Visible peristalsis and No Abnormal pulsations. Umbilicus - No Bleeding, No Urine drainage. Palpation/Percussion Palpation and Percussion of the abdomen reveal - Soft, Non Tender, No Rebound tenderness, No Rigidity (guarding) and No Cutaneous hyperesthesia. Note: Abdomen soft. Mild right  upper quadrant discomfort. Not severely distended. No diastasis recti. No umbilical or other anterior abdominal wall hernias  Female Genitourinary Sexual Maturity Tanner 5 - Adult hair pattern. Note: No vaginal bleeding nor discharge  Peripheral Vascular Upper Extremity Inspection - Left - No Cyanotic nailbeds - Left, Not Ischemic. Inspection - Right - No Cyanotic nailbeds - Right, Not Ischemic.  Neurologic Neurologic evaluation reveals -normal attention span and ability to concentrate, able to name objects and repeat phrases. Appropriate fund of knowledge , normal sensation and normal coordination. Mental Status Affect - not angry, not paranoid. Cranial Nerves-Normal Bilaterally. Gait-Normal.  Neuropsychiatric Mental status exam performed with findings of-able to articulate well with normal speech/language, rate, volume and coherence, thought content normal with ability to perform basic computations and apply abstract reasoning and no evidence of hallucinations, delusions, obsessions or homicidal/suicidal ideation.  Musculoskeletal Global Assessment Spine, Ribs and Pelvis - no instability, subluxation or laxity. Right Upper Extremity - no instability, subluxation or laxity.  Lymphatic Head & Neck  General Head & Neck Lymphatics: Bilateral - Description - No Localized lymphadenopathy. Axillary  General Axillary Region: Bilateral - Description - No Localized lymphadenopathy. Femoral & Inguinal  Generalized Femoral & Inguinal Lymphatics: Left - Description - No Localized lymphadenopathy. Right - Description - No Localized lymphadenopathy.    Assessment & Plan Adin Hector MD; 10/24/2019 3:22 PM)  BILIARY DYSKINESIA (K82.8) Impression: Rather classic history and physical for biliary colic but no stones.  Documentation of decreased gallbladder ejection fraction 11% with some reproduction of symptoms.  The absence of any other etiology, I think she would benefit  from cholecystectomy. Reasonable to do single site approach. Trying consider Kenalog injection and incision to minimize keloid formation.   H/O KELOID OF SKIN (Z87.2) Impression: History of keloid formation at prior incisions. She recalls one treated around the time of C/S surgery with injection.  We'll try and minimize incisions at bellybutton only  with Kenalog steroid injection and Dermabond to minimize that. Should she develop recurrence may require resection and post-adjuvant radiation therapy. She is due to have excision of a left upper back keloid with probable post adjuvant radiation therapy as well by her dermatologist.   CHRONIC CHOLECYSTITIS WITHOUT CALCULUS (K81.1)  Current Plans You are being scheduled for surgery- Our schedulers will call you.  You should hear from our office's scheduling department within 5 working days about the location, date, and time of surgery. We try to make accommodations for patient's preferences in scheduling surgery, but sometimes the OR schedule or the surgeon's schedule prevents [place] from making those accommodations.  If you have not heard from our office [phone]) in 5 working days, call the office and ask for your surgeon's nurse.  If you have other questions about your diagnosis, plan, or surgery, call the office and ask for your surgeon's nurse.  Written instructions provided Pt Education - Pamphlet Given - Laparoscopic Gallbladder Surgery: discussed with patient and provided information. The anatomy & physiology of hepatobiliary & pancreatic function was discussed. The pathophysiology of gallbladder dysfunction was discussed. Natural history risks without surgery was discussed. I feel the risks of no intervention will lead to serious problems that outweigh the operative risks; therefore, I recommended cholecystectomy to remove the pathology. I explained laparoscopic techniques with possible need for an open approach. Probable  cholangiogram to evaluate the bilary tract was explained as well.  Risks such as bleeding, infection, abscess, leak, injury to other organs, need for further treatment, heart attack, death, and other risks were discussed. I noted a good likelihood this will help address the problem. Possibility that this will not correct all abdominal symptoms was explained. Goals of post-operative recovery were discussed as well. We will work to minimize complications. An educational handout further explaining the pathology and treatment options was given as well. Questions were answered. The patient expresses understanding & wishes to proceed with surgery.  Pt Education - [facility] Laparosopic Post Op HCI (Holley Wirt) Pt Education - [facility] Good Bowel Health (Alphonza Tramell) Pt Education - Laparoscopic Cholecystectomy: gallbladder  Adin Hector, MD, FACS, MASCRS Gastrointestinal and Minimally Invasive Surgery  [facility] [address] [place], [place] [address] [phone] Fax [phone] Main/Paging  CONTACT INFORMATION: Weekday (9AM-5PM) concerns: Call [facility] main office at [phone] Weeknight (5PM-9AM) or Weekend/Holiday concerns: Check [email or web address] for General Surgery [facility] coverage (Please, do not use SecureChat as it is not reliable communication to operating surgeons for immediate patient care)

## 2019-10-25 ENCOUNTER — Encounter: Payer: Self-pay | Admitting: Dermatology

## 2019-11-03 ENCOUNTER — Ambulatory Visit: Admission: RE | Admit: 2019-11-03 | Payer: 59 | Source: Ambulatory Visit | Admitting: Radiation Oncology

## 2019-11-04 ENCOUNTER — Encounter: Payer: Self-pay | Admitting: Radiation Oncology

## 2019-11-04 ENCOUNTER — Other Ambulatory Visit: Payer: Self-pay

## 2019-11-04 ENCOUNTER — Ambulatory Visit
Admission: RE | Admit: 2019-11-04 | Discharge: 2019-11-04 | Disposition: A | Discharge status: Home / Self Care | Payer: 59 | Source: Ambulatory Visit | Attending: Radiation Oncology | Admitting: Radiation Oncology

## 2019-11-04 VITALS — BP 137/89 | HR 90 | Temp 97.6°F | Resp 16 | Wt 208.8 lb

## 2019-11-04 DIAGNOSIS — K219 Gastro-esophageal reflux disease without esophagitis: Secondary | ICD-10-CM | POA: Insufficient documentation

## 2019-11-04 DIAGNOSIS — Z79899 Other long term (current) drug therapy: Secondary | ICD-10-CM | POA: Insufficient documentation

## 2019-11-04 DIAGNOSIS — Z923 Personal history of irradiation: Secondary | ICD-10-CM | POA: Insufficient documentation

## 2019-11-04 DIAGNOSIS — L910 Hypertrophic scar: Secondary | ICD-10-CM

## 2019-11-04 DIAGNOSIS — Z803 Family history of malignant neoplasm of breast: Secondary | ICD-10-CM | POA: Insufficient documentation

## 2019-11-04 DIAGNOSIS — Z8711 Personal history of peptic ulcer disease: Secondary | ICD-10-CM | POA: Diagnosis not present

## 2019-11-04 NOTE — Consult Note (Signed)
NEW PATIENT EVALUATION  Name: Julie Vazquez  MRN: [id number]  Date:   11/04/2019     DOB: 12-28-1965   This 54 y.o. female patient presents to the clinic for initial evaluation of keloid on her left posterior back.  REFERRING PHYSICIAN: Ria Bush, MD  CHIEF COMPLAINT:  Chief Complaint  Patient presents with  . Keloid    DIAGNOSIS: The encounter diagnosis was Keloid.   PREVIOUS INVESTIGATIONS:  Clinical notes reviewed  HPI: Patient is a 54 year old female who has developed a keloid on her left posterior back measuring approximately 6 cm.  She is scheduled for the end of July to have surgical excision to be followed by electron-beam therapy to prevent recurrence.  She is otherwise asymptomatic.  PLANNED TREATMENT REGIMEN: Electron-beam therapy postoperatively  PAST MEDICAL HISTORY:  has a past medical history of GERD (gastroesophageal reflux disease), History of peptic ulcer disease, Positive TB test, Rectal mass (08/2010), Seasonal allergies, SUI (stress urinary incontinence, female), and Vitamin D deficiency.    PAST SURGICAL HISTORY:  Past Surgical History:  Procedure Laterality Date  . CESAREAN SECTION    . COLONOSCOPY  09/05/2010   nodular prominence anterior, mobile, likely compression from retoverted uterus  . MIDDLE EAR SURGERY  2000s    FAMILY HISTORY: family history includes Breast cancer (age of onset: 33) in her sister; Cancer in her cousin, maternal aunt, and sister; Coronary artery disease in her maternal grandfather; Diabetes in her maternal grandmother and mother; Hypertension in her mother; Miscarriages / Stillbirths in her maternal uncle; Stroke in her brother and maternal uncle.  SOCIAL HISTORY:  reports that she has never smoked. She has never used smokeless tobacco. She reports current alcohol use. She reports that she does not use drugs.  ALLERGIES: Tessalon [benzonatate]  MEDICATIONS:  Current Outpatient Medications  Medication Sig Dispense  Refill  . diclofenac Sodium (VOLTAREN) 1 % GEL Apply 2 g topically 4 (four) times daily. As needed 100 g 3  . famotidine (PEPCID) 20 MG tablet Take 1 tablet (20 mg total) by mouth at bedtime.    . fluticasone (FLONASE) 50 MCG/ACT nasal spray Place 1 spray into both nostrils daily. Daily as needed    . Multiple Vitamins-Minerals (MULTIVITAMIN PO) Take by mouth.      . Probiotic Product (PROBIOTIC DAILY PO) Take 1 tablet by mouth daily. Reported on 11/27/2015    . triamcinolone cream (KENALOG) 0.1 % Apply 1 application topically 2 (two) times daily. Apply to AA. 30 g 0   No current facility-administered medications for this encounter.    ECOG PERFORMANCE STATUS:  0 - Asymptomatic  REVIEW OF SYSTEMS: Patient denies any weight loss, fatigue, weakness, fever, chills or night sweats. Patient denies any loss of vision, blurred vision. Patient denies any ringing  of the ears or hearing loss. No irregular heartbeat. Patient denies heart murmur or history of fainting. Patient denies any chest pain or pain radiating to her upper extremities. Patient denies any shortness of breath, difficulty breathing at night, cough or hemoptysis. Patient denies any swelling in the lower legs. Patient denies any nausea vomiting, vomiting of blood, or coffee ground material in the vomitus. Patient denies any stomach pain. Patient states has had normal bowel movements no significant constipation or diarrhea. Patient denies any dysuria, hematuria or significant nocturia. Patient denies any problems walking, swelling in the joints or loss of balance. Patient denies any skin changes, loss of hair or loss of weight. Patient denies any excessive worrying or anxiety or significant depression.  Patient denies any problems with insomnia. Patient denies excessive thirst, polyuria, polydipsia. Patient denies any swollen glands, patient denies easy bruising or easy bleeding. Patient denies any recent infections, allergies or URI. Patient "s  visual fields have not changed significantly in recent time.   PHYSICAL EXAM: BP 137/89 (BP Location: Right Arm, Patient Position: Sitting, Cuff Size: Normal)   Pulse 90   Temp 97.6 F (36.4 C) (Tympanic)   Resp 16   Wt 208 lb 12.8 oz (94.7 kg)   LMP 02/14/2015   BMI 40.11 kg/m  Proximally 6 cm keloid on the left posterior back.  Well-developed well-nourished patient in NAD. HEENT reveals PERLA, EOMI, discs not visualized.  Oral cavity is clear. No oral mucosal lesions are identified. Neck is clear without evidence of cervical or supraclavicular adenopathy. Lungs are clear to A&P. Cardiac examination is essentially unremarkable with regular rate and rhythm without murmur rub or thrill. Abdomen is benign with no organomegaly or masses noted. Motor sensory and DTR levels are equal and symmetric in the upper and lower extremities. Cranial nerves II through XII are grossly intact. Proprioception is intact. No peripheral adenopathy or edema is identified. No motor or sensory levels are noted. Crude visual fields are within normal range.  LABORATORY DATA: Labs reviewed    RADIOLOGY RESULTS: No current films to review   IMPRESSION: Keloid of left posterior back in 54 year old female  PLAN: At this time implant and delivering 1200 cGy in 6 fractions of electron-beam therapy starting within 24 hours of her surgical excision.  We will set her up the week before for simulation to plan the treatments.  Risks and benefits of treatment including skin reaction and fatigue were reviewed with the patient.  Patient will be informed she needs to back within 24 hours to start her treatments after her surgical excision.  Patient comprehends my direct recommendations well.  I would like to take this opportunity to thank you for allowing me to participate in the care of your patient.Noreene Filbert, MD

## 2019-11-23 ENCOUNTER — Ambulatory Visit: Payer: 59

## 2019-11-30 ENCOUNTER — Encounter: Payer: 59 | Admitting: Dermatology

## 2019-12-16 ENCOUNTER — Inpatient Hospital Stay (HOSPITAL_COMMUNITY): Admission: RE | Admit: 2019-12-16 | Payer: 59 | Source: Ambulatory Visit

## 2019-12-19 NOTE — Patient Instructions (Addendum)
DUE TO COVID-19 ONLY ONE VISITOR IS ALLOWED TO COME WITH YOU AND STAY IN THE WAITING ROOM ONLY DURING PRE OP AND PROCEDURE DAY OF SURGERY. THE 1 VISITOR  MAY VISIT WITH YOU AFTER SURGERY IN YOUR PRIVATE ROOM DURING VISITING HOURS ONLY!  YOU NEED TO HAVE A COVID 19 TEST ON: 12/24/19@ 9:15 am , THIS TEST MUST BE DONE BEFORE SURGERY,  COVID TESTING SITE [place] [address], IT IS ON THE RIGHT GOING OUT [address] APPROXIMATELY  2 MINUTES PAST [employer] ON THE RIGHT. ONCE YOUR COVID TEST IS COMPLETED,  PLEASE BEGIN THE QUARANTINE INSTRUCTIONS AS OUTLINED IN YOUR HANDOUT.                Julie Vazquez   Your procedure is scheduled on: 12/28/19   Report to [facility] Main  Entrance   Report to admitting at: 6:30 AM     Call this number if you have problems the morning of surgery [phone]    Remember:   NO SOLID FOOD AFTER MIDNIGHT THE NIGHT PRIOR TO SURGERY. NOTHING BY MOUTH EXCEPT CLEAR LIQUIDS UNTIL: 5:30 am . PLEASE FINISH ENSURE DRINK PER SURGEON ORDER  WHICH NEEDS TO BE COMPLETED AT: 5:30 am .  CLEAR LIQUID DIET   Foods Allowed                                                                     Foods Excluded  Coffee and tea, regular and decaf                             liquids that you cannot  Plain Jell-O any favor except red or purple                                           see through such as: Fruit ices (not with fruit pulp)                                     milk, soups, orange juice  Iced Popsicles                                    All solid food Carbonated beverages, regular and diet                                    Cranberry, grape and apple juices Sports drinks like Gatorade Lightly seasoned clear broth or consume(fat free) Sugar, honey syrup  Sample Menu Breakfast                                Lunch  Supper Cranberry juice                    Beef broth                             Chicken broth Jell-O                                     Grape juice                           Apple juice Coffee or tea                        Jell-O                                      Popsicle                                                Coffee or tea                        Coffee or tea  _____________________________________________________________________   BRUSH YOUR TEETH MORNING OF SURGERY AND RINSE YOUR MOUTH OUT, NO CHEWING GUM CANDY OR MINTS.     Take these medicines the morning of surgery with A SIP OF WATER: cetirizine.                               You may not have any metal on your body including hair pins and              piercings  Do not wear jewelry, make-up, lotions, powders or perfumes, deodorant             Do not wear nail polish on your fingernails.  Do not shave  48 hours prior to surgery.           Do not bring valuables to the hospital. Julie Vazquez.  Contacts, dentures or bridgework may not be worn into surgery.  Leave suitcase in the car. After surgery it may be brought to your room.     Patients discharged the day of surgery will not be allowed to drive home. IF YOU ARE HAVING SURGERY AND GOING HOME THE SAME DAY, YOU MUST HAVE AN ADULT TO DRIVE YOU HOME AND BE WITH YOU FOR 24 HOURS. YOU MAY GO HOME BY TAXI OR UBER OR ORTHERWISE, BUT AN ADULT MUST ACCOMPANY YOU HOME AND STAY WITH YOU FOR 24 HOURS.  Name and phone number of your driver:  Special Instructions: N/A              Please read over the following fact sheets you were given: _____________________________________________________________________      [name] - Preparing for Surgery Before surgery, you can play an important role.  Because skin is not sterile, your skin needs to be as free of germs  as possible.  You can reduce the number of germs on your skin by washing with CHG (chlorahexidine gluconate) soap before surgery.  CHG is an antiseptic  cleaner which kills germs and bonds with the skin to continue killing germs even after washing. Please DO NOT use if you have an allergy to CHG or antibacterial soaps.  If your skin becomes reddened/irritated stop using the CHG and inform your nurse when you arrive at Short Stay. Do not shave (including legs and underarms) for at least 48 hours prior to the first CHG shower.  You may shave your face/neck. Please follow these instructions carefully:  1.  Shower with CHG Soap the night before surgery and the  morning of Surgery.  2.  If you choose to wash your hair, wash your hair first as usual with your  normal  shampoo.  3.  After you shampoo, rinse your hair and body thoroughly to remove the  shampoo.                           4.  Use CHG as you would any other liquid soap.  You can apply chg directly  to the skin and wash                       Gently with a scrungie or clean washcloth.  5.  Apply the CHG Soap to your body ONLY FROM THE NECK DOWN.   Do not use on face/ open                           Wound or open sores. Avoid contact with eyes, ears mouth and genitals (private parts).                       Wash face,  Genitals (private parts) with your normal soap.             6.  Wash thoroughly, paying special attention to the area where your surgery  will be performed.  7.  Thoroughly rinse your body with warm water from the neck down.  8.  DO NOT shower/wash with your normal soap after using and rinsing off  the CHG Soap.                9.  Pat yourself dry with a clean towel.            10.  Wear clean pajamas.            11.  Place clean sheets on your bed the night of your first shower and do not  sleep with pets. Day of Surgery : Do not apply any lotions/deodorants the morning of surgery.  Please wear clean clothes to the hospital/surgery center.  FAILURE TO FOLLOW THESE INSTRUCTIONS MAY RESULT IN THE CANCELLATION OF YOUR SURGERY PATIENT  SIGNATURE_________________________________  NURSE SIGNATURE__________________________________  ________________________________________________________________________

## 2019-12-20 ENCOUNTER — Other Ambulatory Visit: Payer: Self-pay

## 2019-12-20 ENCOUNTER — Encounter (HOSPITAL_COMMUNITY)
Admission: RE | Admit: 2019-12-20 | Discharge: 2019-12-20 | Disposition: A | Discharge status: Home / Self Care | Payer: 59 | Source: Ambulatory Visit | Attending: Surgery | Admitting: Surgery

## 2019-12-20 ENCOUNTER — Encounter (HOSPITAL_COMMUNITY): Payer: Self-pay

## 2019-12-20 DIAGNOSIS — Z01812 Encounter for preprocedural laboratory examination: Secondary | ICD-10-CM | POA: Diagnosis not present

## 2019-12-20 HISTORY — DX: Other specified postprocedural states: Z98.890

## 2019-12-20 HISTORY — DX: Prediabetes: R73.03

## 2019-12-20 HISTORY — DX: Anxiety disorder, unspecified: F41.9

## 2019-12-20 HISTORY — DX: Nausea with vomiting, unspecified: R11.2

## 2019-12-20 HISTORY — DX: Other complications of anesthesia, initial encounter: T88.59XA

## 2019-12-20 HISTORY — DX: Anemia, unspecified: D64.9

## 2019-12-20 LAB — CBC
HCT: 40.4 % (ref 36.0–46.0)
Hemoglobin: 13 g/dL (ref 12.0–15.0)
MCH: 29.5 pg (ref 26.0–34.0)
MCHC: 32.2 g/dL (ref 30.0–36.0)
MCV: 91.8 fL (ref 80.0–100.0)
Platelets: 246 10*3/uL (ref 150–400)
RBC: 4.4 MIL/uL (ref 3.87–5.11)
RDW: 13.6 % (ref 11.5–15.5)
WBC: 8.9 10*3/uL (ref 4.0–10.5)
nRBC: 0 % (ref 0.0–0.2)

## 2019-12-20 LAB — HEMOGLOBIN A1C
Hgb A1c MFr Bld: 6.2 % — ABNORMAL HIGH (ref 4.8–5.6)
Mean Plasma Glucose: 131.24 mg/dL

## 2019-12-20 NOTE — Progress Notes (Signed)
COVID Vaccine Completed:yes Date COVID Vaccine completed:09/05/19 COVID vaccine manufacturer: [employer]   PCP - Dr. Ria Bush. LOV: 07/19/19 Cardiologist -   Chest x-ray -  EKG -  Stress Test -  ECHO -  Cardiac Cath -   Sleep Study -  CPAP -   Fasting Blood Sugar -  Checks Blood Sugar _____ times a day  Blood Thinner Instructions: Aspirin Instructions: Last Dose:  Anesthesia review:   Patient denies shortness of breath, fever, cough and chest pain at PAT appointment   Patient verbalized understanding of instructions that were given to them at the PAT appointment. Patient was also instructed that they will need to review over the PAT instructions again at home before surgery.

## 2019-12-24 ENCOUNTER — Other Ambulatory Visit (HOSPITAL_COMMUNITY)
Admission: RE | Admit: 2019-12-24 | Discharge: 2019-12-24 | Disposition: A | Discharge status: Home / Self Care | Payer: 59 | Source: Ambulatory Visit | Attending: Surgery | Admitting: Surgery

## 2019-12-24 DIAGNOSIS — Z01812 Encounter for preprocedural laboratory examination: Secondary | ICD-10-CM | POA: Diagnosis present

## 2019-12-24 DIAGNOSIS — Z20822 Contact with and (suspected) exposure to covid-19: Secondary | ICD-10-CM | POA: Diagnosis not present

## 2019-12-24 LAB — SARS CORONAVIRUS 2 (TAT 6-24 HRS): SARS Coronavirus 2: NEGATIVE

## 2019-12-27 MED ORDER — BUPIVACAINE LIPOSOME 1.3 % IJ SUSP
20.0000 mL | Freq: Once | INTRAMUSCULAR | Status: DC
  Filled 2019-12-27: qty 20

## 2019-12-28 ENCOUNTER — Ambulatory Visit (HOSPITAL_COMMUNITY): Payer: 59 | Admitting: Certified Registered"

## 2019-12-28 ENCOUNTER — Encounter (HOSPITAL_COMMUNITY)
Admission: RE | Disposition: A | Discharge status: Home / Self Care | Payer: Self-pay | Source: Other Acute Inpatient Hospital | Attending: Surgery

## 2019-12-28 ENCOUNTER — Ambulatory Visit (HOSPITAL_COMMUNITY)
Admission: RE | Admit: 2019-12-28 | Discharge: 2019-12-28 | Disposition: A | Discharge status: Home / Self Care | Payer: 59 | Source: Other Acute Inpatient Hospital | Attending: Surgery | Admitting: Surgery

## 2019-12-28 ENCOUNTER — Encounter (HOSPITAL_COMMUNITY): Payer: Self-pay | Admitting: Surgery

## 2019-12-28 ENCOUNTER — Ambulatory Visit (HOSPITAL_COMMUNITY): Payer: 59

## 2019-12-28 DIAGNOSIS — K828 Other specified diseases of gallbladder: Secondary | ICD-10-CM | POA: Diagnosis present

## 2019-12-28 DIAGNOSIS — L910 Hypertrophic scar: Secondary | ICD-10-CM | POA: Diagnosis not present

## 2019-12-28 DIAGNOSIS — Z8711 Personal history of peptic ulcer disease: Secondary | ICD-10-CM | POA: Insufficient documentation

## 2019-12-28 DIAGNOSIS — K811 Chronic cholecystitis: Secondary | ICD-10-CM | POA: Insufficient documentation

## 2019-12-28 DIAGNOSIS — Z419 Encounter for procedure for purposes other than remedying health state, unspecified: Secondary | ICD-10-CM

## 2019-12-28 DIAGNOSIS — Z888 Allergy status to other drugs, medicaments and biological substances status: Secondary | ICD-10-CM | POA: Diagnosis not present

## 2019-12-28 DIAGNOSIS — E669 Obesity, unspecified: Secondary | ICD-10-CM | POA: Diagnosis present

## 2019-12-28 DIAGNOSIS — K760 Fatty (change of) liver, not elsewhere classified: Secondary | ICD-10-CM

## 2019-12-28 DIAGNOSIS — Z6841 Body Mass Index (BMI) 40.0 and over, adult: Secondary | ICD-10-CM

## 2019-12-28 DIAGNOSIS — Z6838 Body mass index (BMI) 38.0-38.9, adult: Secondary | ICD-10-CM | POA: Diagnosis not present

## 2019-12-28 HISTORY — PX: LAPAROSCOPIC CHOLECYSTECTOMY SINGLE SITE WITH INTRAOPERATIVE CHOLANGIOGRAM: SHX6538

## 2019-12-28 SURGERY — LAPAROSCOPIC CHOLECYSTECTOMY SINGLE SITE WITH INTRAOPERATIVE CHOLANGIOGRAM
Anesthesia: General

## 2019-12-28 MED ORDER — CHLORHEXIDINE GLUCONATE CLOTH 2 % EX PADS
6.0000 | MEDICATED_PAD | Freq: Once | CUTANEOUS | Status: AC
  Administered 2019-12-28: 6 via TOPICAL

## 2019-12-28 MED ORDER — ENSURE PRE-SURGERY PO LIQD
296.0000 mL | Freq: Once | ORAL | Status: DC
  Filled 2019-12-28: qty 296

## 2019-12-28 MED ORDER — ONDANSETRON HCL 4 MG/2ML IJ SOLN
INTRAMUSCULAR | Status: DC | PRN
  Administered 2019-12-28: 4 mg via INTRAVENOUS

## 2019-12-28 MED ORDER — GABAPENTIN 300 MG PO CAPS
300.0000 mg | ORAL_CAPSULE | ORAL | Status: AC
  Administered 2019-12-28: 300 mg via ORAL
  Filled 2019-12-28: qty 1

## 2019-12-28 MED ORDER — CELECOXIB 200 MG PO CAPS
200.0000 mg | ORAL_CAPSULE | ORAL | Status: AC
  Administered 2019-12-28: 200 mg via ORAL
  Filled 2019-12-28: qty 1

## 2019-12-28 MED ORDER — FENTANYL CITRATE (PF) 250 MCG/5ML IJ SOLN
INTRAMUSCULAR | Status: DC | PRN
  Administered 2019-12-28: 100 ug via INTRAVENOUS
  Administered 2019-12-28: 25 ug via INTRAVENOUS
  Administered 2019-12-28: 50 ug via INTRAVENOUS
  Administered 2019-12-28: 25 ug via INTRAVENOUS

## 2019-12-28 MED ORDER — TRAMADOL HCL 50 MG PO TABS: 50.0000 mg | ORAL_TABLET | Freq: Four times a day (QID) | ORAL | 0 refills | Status: DC | PRN

## 2019-12-28 MED ORDER — MIDAZOLAM HCL 2 MG/2ML IJ SOLN
INTRAMUSCULAR | Status: DC | PRN
  Administered 2019-12-28: 2 mg via INTRAVENOUS

## 2019-12-28 MED ORDER — SUGAMMADEX SODIUM 200 MG/2ML IV SOLN
INTRAVENOUS | Status: DC | PRN
  Administered 2019-12-28: 190 mg via INTRAVENOUS

## 2019-12-28 MED ORDER — 0.9 % SODIUM CHLORIDE (POUR BTL) OPTIME
TOPICAL | Status: DC | PRN
  Administered 2019-12-28: 1000 mL

## 2019-12-28 MED ORDER — ORAL CARE MOUTH RINSE: 15.0000 mL | Freq: Once | OROMUCOSAL | Status: AC

## 2019-12-28 MED ORDER — LIDOCAINE 2% (20 MG/ML) 5 ML SYRINGE
INTRAMUSCULAR | Status: AC
  Filled 2019-12-28: qty 5

## 2019-12-28 MED ORDER — DROPERIDOL 2.5 MG/ML IJ SOLN
INTRAMUSCULAR | Status: DC | PRN
  Administered 2019-12-28: .625 mg via INTRAVENOUS

## 2019-12-28 MED ORDER — DROPERIDOL 2.5 MG/ML IJ SOLN
INTRAMUSCULAR | Status: AC
  Filled 2019-12-28: qty 2

## 2019-12-28 MED ORDER — LIDOCAINE 2% (20 MG/ML) 5 ML SYRINGE
INTRAMUSCULAR | Status: DC | PRN
  Administered 2019-12-28: 40 mg via INTRAVENOUS

## 2019-12-28 MED ORDER — MIDAZOLAM HCL 2 MG/2ML IJ SOLN
INTRAMUSCULAR | Status: AC
  Filled 2019-12-28: qty 2

## 2019-12-28 MED ORDER — TRIAMCINOLONE ACETONIDE 40 MG/ML IJ SUSP
40.0000 mg | INTRAMUSCULAR | Status: DC
  Filled 2019-12-28: qty 1

## 2019-12-28 MED ORDER — ROCURONIUM BROMIDE 10 MG/ML (PF) SYRINGE
PREFILLED_SYRINGE | INTRAVENOUS | Status: DC | PRN
  Administered 2019-12-28: 60 mg via INTRAVENOUS
  Administered 2019-12-28: 10 mg via INTRAVENOUS

## 2019-12-28 MED ORDER — BUPIVACAINE-EPINEPHRINE (PF) 0.25% -1:200000 IJ SOLN
INTRAMUSCULAR | Status: AC
  Filled 2019-12-28: qty 30

## 2019-12-28 MED ORDER — FENTANYL CITRATE (PF) 100 MCG/2ML IJ SOLN
INTRAMUSCULAR | Status: AC
  Filled 2019-12-28: qty 2

## 2019-12-28 MED ORDER — PHENYLEPHRINE HCL (PRESSORS) 10 MG/ML IV SOLN
INTRAVENOUS | Status: AC
  Filled 2019-12-28: qty 1

## 2019-12-28 MED ORDER — TRIAMCINOLONE ACETONIDE 40 MG/ML IJ SUSP
INTRAMUSCULAR | Status: DC | PRN
  Administered 2019-12-28: 9 mL via INTRAMUSCULAR

## 2019-12-28 MED ORDER — LACTATED RINGERS IV SOLN: INTRAVENOUS | Status: DC

## 2019-12-28 MED ORDER — BUPIVACAINE-EPINEPHRINE 0.25% -1:200000 IJ SOLN
INTRAMUSCULAR | Status: DC | PRN
  Administered 2019-12-28: 30 mL

## 2019-12-28 MED ORDER — PROPOFOL 10 MG/ML IV BOLUS
INTRAVENOUS | Status: DC | PRN
  Administered 2019-12-28: 170 mg via INTRAVENOUS
  Administered 2019-12-28: 30 mg via INTRAVENOUS

## 2019-12-28 MED ORDER — PROPOFOL 10 MG/ML IV BOLUS
INTRAVENOUS | Status: AC
  Filled 2019-12-28: qty 40

## 2019-12-28 MED ORDER — ROCURONIUM BROMIDE 10 MG/ML (PF) SYRINGE
PREFILLED_SYRINGE | INTRAVENOUS | Status: AC
  Filled 2019-12-28: qty 10

## 2019-12-28 MED ORDER — ONDANSETRON HCL 4 MG/2ML IJ SOLN
INTRAMUSCULAR | Status: AC
  Filled 2019-12-28: qty 2

## 2019-12-28 MED ORDER — LACTATED RINGERS IV SOLN
INTRAVENOUS | Status: DC | PRN
Start: 2019-12-28 — End: 2019-12-28

## 2019-12-28 MED ORDER — BUPIVACAINE LIPOSOME 1.3 % IJ SUSP
INTRAMUSCULAR | Status: DC | PRN
  Administered 2019-12-28: 20 mL

## 2019-12-28 MED ORDER — DEXAMETHASONE SODIUM PHOSPHATE 10 MG/ML IJ SOLN
INTRAMUSCULAR | Status: AC
  Filled 2019-12-28: qty 1

## 2019-12-28 MED ORDER — CHLORHEXIDINE GLUCONATE CLOTH 2 % EX PADS: 6.0000 | MEDICATED_PAD | Freq: Once | CUTANEOUS | Status: DC

## 2019-12-28 MED ORDER — DEXAMETHASONE SODIUM PHOSPHATE 10 MG/ML IJ SOLN
INTRAMUSCULAR | Status: DC | PRN
  Administered 2019-12-28: 8 mg via INTRAVENOUS

## 2019-12-28 MED ORDER — TRIAMCINOLONE ACETONIDE 40 MG/ML IJ SUSP
INTRAMUSCULAR | Status: AC
  Filled 2019-12-28: qty 1

## 2019-12-28 MED ORDER — PHENYLEPHRINE 40 MCG/ML (10ML) SYRINGE FOR IV PUSH (FOR BLOOD PRESSURE SUPPORT)
PREFILLED_SYRINGE | INTRAVENOUS | Status: AC
  Filled 2019-12-28: qty 10

## 2019-12-28 MED ORDER — STERILE WATER FOR IRRIGATION IR SOLN
Status: DC | PRN
  Administered 2019-12-28: 1000 mL

## 2019-12-28 MED ORDER — METRONIDAZOLE IN NACL 5-0.79 MG/ML-% IV SOLN
500.0000 mg | INTRAVENOUS | Status: AC
  Administered 2019-12-28: 500 mg via INTRAVENOUS
  Filled 2019-12-28: qty 100

## 2019-12-28 MED ORDER — SODIUM CHLORIDE 0.9 % IV SOLN
2.0000 g | INTRAVENOUS | Status: AC
  Administered 2019-12-28: 2 g via INTRAVENOUS
  Filled 2019-12-28: qty 20

## 2019-12-28 MED ORDER — ACETAMINOPHEN 500 MG PO TABS
1000.0000 mg | ORAL_TABLET | ORAL | Status: AC
  Administered 2019-12-28: 1000 mg via ORAL
  Filled 2019-12-28: qty 2

## 2019-12-28 MED ORDER — CHLORHEXIDINE GLUCONATE 0.12 % MT SOLN
15.0000 mL | Freq: Once | OROMUCOSAL | Status: AC
  Administered 2019-12-28: 15 mL via OROMUCOSAL

## 2019-12-28 SURGICAL SUPPLY — 49 items
ADH SKN CLS APL DERMABOND .7 (GAUZE/BANDAGES/DRESSINGS) ×1
APL PRP STRL LF DISP 70% ISPRP (MISCELLANEOUS) ×1
APPLIER CLIP 5 13 M/L LIGAMAX5 (MISCELLANEOUS) ×2
APR CLP MED LRG 5 ANG JAW (MISCELLANEOUS) ×1
BAG SPEC RTRVL 10 TROC 200 (ENDOMECHANICALS) ×1
CABLE HIGH FREQUENCY MONO STRZ (ELECTRODE) ×2 IMPLANT
CHLORAPREP W/TINT 26 (MISCELLANEOUS) ×2 IMPLANT
CLIP APPLIE 5 13 M/L LIGAMAX5 (MISCELLANEOUS) ×1 IMPLANT
COVER MAYO STAND STRL (DRAPES) ×2 IMPLANT
COVER SURGICAL LIGHT HANDLE (MISCELLANEOUS) ×2 IMPLANT
COVER WAND RF STERILE (DRAPES) IMPLANT
DECANTER SPIKE VIAL GLASS SM (MISCELLANEOUS) ×2 IMPLANT
DERMABOND ADVANCED (GAUZE/BANDAGES/DRESSINGS) ×1
DERMABOND ADVANCED .7 DNX12 (GAUZE/BANDAGES/DRESSINGS) ×1 IMPLANT
DRAIN CHANNEL 19F RND (DRAIN) IMPLANT
DRAPE C-ARM 42X120 X-RAY (DRAPES) ×2 IMPLANT
DRAPE WARM FLUID 44X44 (DRAPES) ×2 IMPLANT
DRSG TEGADERM 4X4.75 (GAUZE/BANDAGES/DRESSINGS) ×2 IMPLANT
DRSG TEGADERM 6X8 (GAUZE/BANDAGES/DRESSINGS) ×2 IMPLANT
ELECT REM PT RETURN 15FT ADLT (MISCELLANEOUS) ×2 IMPLANT
ENDOLOOP SUT PDS II  0 18 (SUTURE)
ENDOLOOP SUT PDS II 0 18 (SUTURE) IMPLANT
EVACUATOR SILICONE 100CC (DRAIN) IMPLANT
GAUZE SPONGE 2X2 8PLY STRL LF (GAUZE/BANDAGES/DRESSINGS) ×1 IMPLANT
GLOVE ECLIPSE 8.0 STRL XLNG CF (GLOVE) ×2 IMPLANT
GLOVE INDICATOR 8.0 STRL GRN (GLOVE) ×2 IMPLANT
GOWN STRL REUS W/TWL XL LVL3 (GOWN DISPOSABLE) ×4 IMPLANT
IRRIG SUCT STRYKERFLOW 2 WTIP (MISCELLANEOUS) ×2
IRRIGATION SUCT STRKRFLW 2 WTP (MISCELLANEOUS) ×1 IMPLANT
KIT BASIN OR (CUSTOM PROCEDURE TRAY) ×2 IMPLANT
KIT TURNOVER KIT A (KITS) IMPLANT
PAD POSITIONING PINK XL (MISCELLANEOUS) ×2 IMPLANT
PENCIL SMOKE EVACUATOR (MISCELLANEOUS) IMPLANT
POUCH RETRIEVAL ECOSAC 10 (ENDOMECHANICALS) ×1 IMPLANT
POUCH RETRIEVAL ECOSAC 10MM (ENDOMECHANICALS) ×2
PROTECTOR NERVE ULNAR (MISCELLANEOUS) IMPLANT
SCISSORS LAP 5X35 DISP (ENDOMECHANICALS) ×2 IMPLANT
SET CHOLANGIOGRAPH MIX (MISCELLANEOUS) ×2 IMPLANT
SET TUBE SMOKE EVAC HIGH FLOW (TUBING) ×2 IMPLANT
SHEARS HARMONIC ACE PLUS 36CM (ENDOMECHANICALS) ×2 IMPLANT
SPONGE GAUZE 2X2 STER 10/PKG (GAUZE/BANDAGES/DRESSINGS) ×1
SUT MNCRL AB 4-0 PS2 18 (SUTURE) ×2 IMPLANT
SUT PDS AB 1 CT1 27 (SUTURE) ×4 IMPLANT
SYR 20ML LL LF (SYRINGE) ×2 IMPLANT
TOWEL OR 17X26 10 PK STRL BLUE (TOWEL DISPOSABLE) ×2 IMPLANT
TOWEL OR NON WOVEN STRL DISP B (DISPOSABLE) ×2 IMPLANT
TRAY LAPAROSCOPIC (CUSTOM PROCEDURE TRAY) ×2 IMPLANT
TROCAR BLADELESS OPT 5 100 (ENDOMECHANICALS) ×2 IMPLANT
TROCAR BLADELESS OPT 5 150 (ENDOMECHANICALS) ×2 IMPLANT

## 2019-12-28 NOTE — Anesthesia Postprocedure Evaluation (Signed)
Anesthesia Post Note  Patient: Chauntelle Azpeitia  Procedure(s) Performed: LAPAROSCOPIC CHOLECYSTECTOMY SINGLE SITE WITH CHOLANGIOGRAM (N/A )     Patient location during evaluation: PACU Anesthesia Type: General Level of consciousness: awake and alert Pain management: pain level controlled Vital Signs Assessment: post-procedure vital signs reviewed and stable Respiratory status: spontaneous breathing, nonlabored ventilation and respiratory function stable Cardiovascular status: blood pressure returned to baseline and stable Postop Assessment: no apparent nausea or vomiting Anesthetic complications: no   No complications documented.  Last Vitals:  Vitals:   12/28/19 1030 12/28/19 1100  BP: 140/82 (!) 149/85  Pulse: 94 98  Resp: 18 18  Temp: (!) 36.4 C   SpO2: 93% 95%    Last Pain:  Vitals:   12/28/19 1100  TempSrc:   PainSc: 0-No pain                 Lynda Rainwater

## 2019-12-28 NOTE — H&P (Signed)
Julie Vazquez DOB: April 29, 1966 Married / Language: Undefined / Race: Refused to Report/Unreported Female  Patient Care Team: Ria Bush, MD as PCP - General (Family Medicine) Bary Castilla, Forest Gleason, MD (General Surgery) Ria Bush, MD as Consulting Physician (Family Medicine) Noreene Filbert, MD as Radiation Oncologist (Radiation Oncology)  Marland Kitchen Patient sent for surgical consultation at the request of Ria Bush  Chief Complaint: Abdominal pain. Probably her dyskinesia. ` ` The patient is a wound his had recurrent right upper quadrant pain. Usually triggered by eating greasy foods. She is try to adjust her diet but still has some attacks. Had episode of severe epigastric pain with loose bowel movements. Concerned her. Workup revealed no gallstones. Sent for a gallbladder HIDA study with an ejection fraction which was 11%. Some discomfort afterwards but no severe recurrent attack. Concern of possible heartburn and reflux. Placed on Pepcid. Seem to help with heartburns but still getting attacks. Surgical consultation offered.  She comes today by herself. From [place] area. Then [place]. More recently in [place]. Has a history of C-section in the 1990s. Apparently had some hypertrophic scarring but was managed easily. She has a keloid-like scar in her left upper back. Seen hematology. Discussion about doing excision with radiation therapy. She had a colonoscopy a while ago. She believes was done [facility]. Underwhelming. Moves her bowels about 2-3 times a day. She can walk at least an hour without difficulty. She does not smoke. She rarely drinks any alcohol. No diabetes. Some seasonal allergies. No personal nor family history of GI/colon cancer, inflammatory bowel disease, irritable bowel syndrome, allergy such as Celiac Sprue, dietary/dairy problems, colitis, ulcers nor gastritis. No recent sick contacts/gastroenteritis. No travel  outside the country. No changes in diet. No dysphagia to solids or liquids. No significant heartburn or reflux. No melena, hematemesis, coffee ground emesis. No evidence of prior gastric/peptic ulceration.  (Review of systems as stated in this history (HPI) or in the review of systems. Otherwise all other 12 point ROS are negative) ` ` `  This patient encounter took 30 minutes today to perform the following: obtain history, perform exam, review outside records, interpret tests & imaging, counsel the patient on their diagnosis; and, document this encounter, including findings & plan in the electronic health record (EHR).   Allergies (Chanel Teressa Senter, CMA; 10/24/2019 2:45 PM) Lavella Lemons *COUGH/COLD/ALLERGY* Allergies Reconciled  Medication History (Chanel Teressa Senter, CMA; 10/24/2019 2:45 PM) ZyrTEC (Oral) Specific strength unknown - Active. Medications Reconciled    Vitals (Chanel Nolan CMA; 10/24/2019 2:46 PM) 10/24/2019 2:45 PM Weight: 209.38 lb Height: 62in Body Surface Area: 1.95 m Body Mass Index: 38.29 kg/m  Temp.: 97.59F  Pulse: 120 (Regular)  BP: 120/74(Sitting, Left Arm, Standard)   BP (!) 151/86   Pulse 77   Temp 98.1 F (36.7 C) (Oral)   Resp 17   Ht 5\' 1"  (1.549 m)   Wt 93.4 kg   LMP 02/14/2015   SpO2 99%   BMI 38.91 kg/m  12/28/2019      Physical Exam Adin Hector MD; 10/24/2019 3:22 PM)  General Mental Status-Alert. General Appearance-Not in acute distress, Not Sickly. Orientation-Oriented X3. Hydration-Well hydrated. Voice-Normal.  Integumentary Global Assessment Upon inspection and palpation of skin surfaces of the - Axillae: non-tender, no inflammation or ulceration, no drainage. and Distribution of scalp and body hair is normal. General Characteristics Temperature - normal warmth is noted.  Head and Neck Head-normocephalic, atraumatic with no lesions or palpable masses. Face Global Assessment -  atraumatic, no absence of  expression. Neck Global Assessment - no abnormal movements, no bruit auscultated on the right, no bruit auscultated on the left, no decreased range of motion, non-tender. Trachea-midline. Thyroid Gland Characteristics - non-tender.  Eye Eyeball - Left-Extraocular movements intact, No Nystagmus - Left. Eyeball - Right-Extraocular movements intact, No Nystagmus - Right. Cornea - Left-No Hazy - Left. Cornea - Right-No Hazy - Right. Sclera/Conjunctiva - Left-No scleral icterus, No Discharge - Left. Sclera/Conjunctiva - Right-No scleral icterus, No Discharge - Right. Pupil - Left-Direct reaction to light normal. Pupil - Right-Direct reaction to light normal.  ENMT Ears Pinna - Left - no drainage observed, no generalized tenderness observed. Pinna - Right - no drainage observed, no generalized tenderness observed. Nose and Sinuses External Inspection of the Nose - no destructive lesion observed. Inspection of the nares - Left - quiet respiration. Inspection of the nares - Right - quiet respiration. Mouth and Throat Lips - Upper Lip - no fissures observed, no pallor noted. Lower Lip - no fissures observed, no pallor noted. Nasopharynx - no discharge present. Oral Cavity/Oropharynx - Tongue - no dryness observed. Oral Mucosa - no cyanosis observed. Hypopharynx - no evidence of airway distress observed.  Chest and Lung Exam Inspection Movements - Normal and Symmetrical. Accessory muscles - No use of accessory muscles in breathing. Palpation Palpation of the chest reveals - Non-tender. Auscultation Breath sounds - Normal and Clear.  Cardiovascular Auscultation Rhythm - Regular. Murmurs & Other Heart Sounds - Auscultation of the heart reveals - No Murmurs and No Systolic Clicks.  Abdomen Inspection Inspection of the abdomen reveals - No Visible peristalsis and No Abnormal pulsations. Umbilicus - No Bleeding, No Urine  drainage. Palpation/Percussion Palpation and Percussion of the abdomen reveal - Soft, Non Tender, No Rebound tenderness, No Rigidity (guarding) and No Cutaneous hyperesthesia. Note: Abdomen soft. Mild right upper quadrant discomfort. Not severely distended. No diastasis recti. No umbilical or other anterior abdominal wall hernias  Female Genitourinary Sexual Maturity Tanner 5 - Adult hair pattern. Note: No vaginal bleeding nor discharge  Peripheral Vascular Upper Extremity Inspection - Left - No Cyanotic nailbeds - Left, Not Ischemic. Inspection - Right - No Cyanotic nailbeds - Right, Not Ischemic.  Neurologic Neurologic evaluation reveals -normal attention span and ability to concentrate, able to name objects and repeat phrases. Appropriate fund of knowledge , normal sensation and normal coordination. Mental Status Affect - not angry, not paranoid. Cranial Nerves-Normal Bilaterally. Gait-Normal.  Neuropsychiatric Mental status exam performed with findings of-able to articulate well with normal speech/language, rate, volume and coherence, thought content normal with ability to perform basic computations and apply abstract reasoning and no evidence of hallucinations, delusions, obsessions or homicidal/suicidal ideation.  Musculoskeletal Global Assessment Spine, Ribs and Pelvis - no instability, subluxation or laxity. Right Upper Extremity - no instability, subluxation or laxity.  Lymphatic Head & Neck  General Head & Neck Lymphatics: Bilateral - Description - No Localized lymphadenopathy. Axillary  General Axillary Region: Bilateral - Description - No Localized lymphadenopathy. Femoral & Inguinal  Generalized Femoral & Inguinal Lymphatics: Left - Description - No Localized lymphadenopathy. Right - Description - No Localized lymphadenopathy.    Assessment & Plan BILIARY DYSKINESIA (K82.8) Impression: Rather classic history and physical for biliary  colic but no stones.  Documentation of decreased gallbladder ejection fraction 11% with some reproduction of symptoms.  The absence of any other etiology, I think she would benefit from cholecystectomy. Reasonable to do single site approach. Trying consider Kenalog injection and incision to minimize keloid formation.  H/O KELOID OF SKIN (Z87.2) Impression: History of keloid formation at prior incisions. She recalls one treated around the time of C/S surgery with injection.  We'll try and minimize incisions at bellybutton only with Kenalog steroid injection and Dermabond to minimize that. Should she develop recurrence may require resection and post-adjuvant radiation therapy. She is due to have excision of a left upper back keloid with probable post adjuvant radiation therapy as well by her dermatologist.   CHRONIC CHOLECYSTITIS WITHOUT CALCULUS (K81.1)  Current Plans You are being scheduled for surgery- Our schedulers will call you.  You should hear from our office's scheduling department within 5 working days about the location, date, and time of surgery. We try to make accommodations for patient's preferences in scheduling surgery, but sometimes the OR schedule or the surgeon's schedule prevents [place] from making those accommodations.  If you have not heard from our office [phone] in 5 working days, call the office and ask for your surgeon's nurse.  If you have other questions about your diagnosis, plan, or surgery, call the office and ask for your surgeon's nurse.  Written instructions provided Pt Education - Pamphlet Given - Laparoscopic Gallbladder Surgery: discussed with patient and provided information. The anatomy & physiology of hepatobiliary & pancreatic function was discussed. The pathophysiology of gallbladder dysfunction was discussed. Natural history risks without surgery was discussed. I feel the risks of no intervention will lead to serious problems  that outweigh the operative risks; therefore, I recommended cholecystectomy to remove the pathology. I explained laparoscopic techniques with possible need for an open approach. Probable cholangiogram to evaluate the bilary tract was explained as well.  Risks such as bleeding, infection, abscess, leak, injury to other organs, need for further treatment, heart attack, death, and other risks were discussed. I noted a good likelihood this will help address the problem. Possibility that this will not correct all abdominal symptoms was explained. Goals of post-operative recovery were discussed as well. We will work to minimize complications. An educational handout further explaining the pathology and treatment options was given as well. Questions were answered. The patient expresses understanding & wishes to proceed with surgery.  Pt Education - [facility] Laparosopic Post Op HCI (Gladyse Corvin) Pt Education - [facility] Good Bowel Health (Vernetta Dizdarevic) Pt Education - Laparoscopic Cholecystectomy: gallbladder  Adin Hector, MD, FACS, MASCRS Gastrointestinal and Minimally Invasive Surgery  [facility] [address] [place], [place] [address] [phone] Fax [phone] Main/Paging  CONTACT INFORMATION: Weekday (9AM-5PM) concerns: Call [facility] main office at [phone] Weeknight (5PM-9AM) or Weekend/Holiday concerns: Check [email or web address] for General Surgery [facility] coverage (Please, do not use SecureChat as it is not reliable communication to operating surgeons for immediate patient care)

## 2019-12-28 NOTE — Anesthesia Procedure Notes (Signed)
Procedure Name: Intubation Date/Time: 12/28/2019 8:33 AM Performed by: Eben Burow, CRNA Pre-anesthesia Checklist: Patient identified, Emergency Drugs available, Suction available, Patient being monitored and Timeout performed Patient Re-evaluated:Patient Re-evaluated prior to induction Oxygen Delivery Method: Circle system utilized Preoxygenation: Pre-oxygenation with 100% oxygen Induction Type: IV induction Ventilation: Mask ventilation without difficulty Laryngoscope Size: Mac and 4 Grade View: Grade I Tube type: Oral Tube size: 7.0 mm Number of attempts: 1 Airway Equipment and Method: Stylet Placement Confirmation: ETT inserted through vocal cords under direct vision,  positive ETCO2 and breath sounds checked- equal and bilateral Secured at: 20 cm Tube secured with: Tape Dental Injury: Teeth and Oropharynx as per pre-operative assessment

## 2019-12-28 NOTE — Discharge Instructions (Signed)
LAPAROSCOPIC SURGERY: POST OP INSTRUCTIONS  ######################################################################  EAT Gradually transition to a high fiber diet with a fiber supplement over the next few weeks after discharge.  Start with a pureed / full liquid diet (see below)  WALK Walk an hour a day.  Control your pain to do that.    CONTROL PAIN Control pain so that you can walk, sleep, tolerate sneezing/coughing, go up/down stairs.  HAVE A BOWEL MOVEMENT DAILY Keep your bowels regular to avoid problems.  OK to try a laxative to override constipation.  OK to use an antidairrheal to slow down diarrhea.  Call if not better after 2 tries  CALL IF YOU HAVE PROBLEMS/CONCERNS Call if you are still struggling despite following these instructions. Call if you have concerns not answered by these instructions  ######################################################################    1. DIET: Follow a light bland diet & liquids the first 24 hours after arrival home, such as soup, liquids, starches, etc.  Be sure to drink plenty of fluids.  Quickly advance to a usual solid diet within a few days.  Avoid fast food or heavy meals as your are more likely to get nauseated or have irregular bowels.  A low-fat, high-fiber diet for the rest of your life is ideal.  2. Take your usually prescribed home medications unless otherwise directed.  3. PAIN CONTROL: a. Pain is best controlled by a usual combination of three different methods TOGETHER: i. Ice/Heat ii. Over the counter pain medication iii. Prescription pain medication b. Most patients will experience some swelling and bruising around the incisions.  Ice packs or heating pads (30-60 minutes up to 6 times a day) will help. Use ice for the first few days to help decrease swelling and bruising, then switch to heat to help relax tight/sore spots and speed recovery.  Some people prefer to use ice alone, heat alone, alternating between ice & heat.   Experiment to what works for you.  Swelling and bruising can take several weeks to resolve.   c. It is helpful to take an over-the-counter pain medication regularly for the first few weeks.  Choose one of the following that works best for you: i. Naproxen (Aleve, etc)  Two 220mg tabs twice a day ii. Ibuprofen (Advil, etc) Three 200mg tabs four times a day (every meal & bedtime) iii. Acetaminophen (Tylenol, etc) 500-650mg four times a day (every meal & bedtime) d. A  prescription for pain medication (such as oxycodone, hydrocodone, tramadol, gabapentin, methocarbamol, etc) should be given to you upon discharge.  Take your pain medication as prescribed.  i. If you are having problems/concerns with the prescription medicine (does not control pain, nausea, vomiting, rash, itching, etc), please call us [phone] to see if we need to switch you to a different pain medicine that will work better for you and/or control your side effect better. ii. If you need a refill on your pain medication, please give us 48 hour notice.  contact your pharmacy.  They will contact our office to request authorization. Prescriptions will not be filled after 5 pm or on week-ends  4. Avoid getting constipated.   a. Between the surgery and the pain medications, it is common to experience some constipation.   b. Increasing fluid intake and taking a fiber supplement (such as Metamucil, Citrucel, FiberCon, MiraLax, etc) 1-2 times a day regularly will usually help prevent this problem from occurring.   c. A mild laxative (prune juice, Milk of Magnesia, MiraLax, etc) should be taken according to   package directions if there are no bowel movements after 48 hours.   5. Watch out for diarrhea.   a. If you have many loose bowel movements, simplify your diet to bland foods & liquids for a few days.   b. Stop any stool softeners and decrease your fiber supplement.   c. Switching to mild anti-diarrheal medications (Kayopectate, Pepto  Bismol) can help.   d. If this worsens or does not improve, please call us.  6. Wash / shower every day.  You may shower over the dressings as they are waterproof.  Continue to shower over incision(s) after the dressing is off.  7. Remove your waterproof bandages 5 days after surgery.  You may leave the incision open to air.  Dermabond purple scab will fall off in 7-14 days.  You may replace a dressing/Band-Aid to cover the incision for comfort if you wish.   8. ACTIVITIES as tolerated:   a. You may resume regular (light) daily activities beginning the next day--such as daily self-care, walking, climbing stairs--gradually increasing activities as tolerated.  If you can walk 30 minutes without difficulty, it is safe to try more intense activity such as jogging, treadmill, bicycling, low-impact aerobics, swimming, etc. b. Save the most intensive and strenuous activity for last such as sit-ups, heavy lifting, contact sports, etc  Refrain from any heavy lifting or straining until you are off narcotics for pain control.   c. DO NOT PUSH THROUGH PAIN.  Let pain be your guide: If it hurts to do something, don't do it.  Pain is your body warning you to avoid that activity for another week until the pain goes down. d. You may drive when you are no longer taking prescription pain medication, you can comfortably wear a seatbelt, and you can safely maneuver your car and apply brakes. e. Dennis Bast may have sexual intercourse when it is comfortable.  9. FOLLOW UP in our office a. Please call [facility] at [phone] to set up an appointment to see your surgeon in the office for a follow-up appointment approximately 2-3 weeks after your surgery. b. Make sure that you call for this appointment the day you arrive home to insure a convenient appointment time.  10. IF YOU HAVE DISABILITY OR FAMILY LEAVE FORMS, BRING THEM TO THE OFFICE FOR PROCESSING.  DO NOT GIVE THEM TO YOUR DOCTOR.   WHEN TO CALL us [phone]  [phone]: 1. Poor pain control 2. Reactions / problems with new medications (rash/itching, nausea, etc)  3. Fever over 101.5 F (38.5 C) 4. Inability to urinate 5. Nausea and/or vomiting 6. Worsening swelling or bruising 7. Continued bleeding from incision. 8. Increased pain, redness, or drainage from the incision   The clinic staff is available to answer your questions during regular business hours (8:30am-5pm).  Please don't hesitate to call and ask to speak to one of our nurses for clinical concerns.   If you have a medical emergency, go to the nearest emergency room or call 911.  A surgeon from [facility] is always on call at the [facility], [place], [place], [place], [place]  [address] ? MAIN: [phone] ? TOLL FREE: [phone] ?  FAX [phone] [email or web address]   Colecistitis Cholecystitis  La colecistitis es la inflamacin de la vescula biliar. A menudo se la conoce como ataque de la vescula biliar. La vescula biliar es un rgano que tiene forma de pera y se encuentra debajo del hgado, del  lado derecho del cuerpo. La vescula biliar almacena bilis, un lquido que ayuda al organismo a digerir las grasas. Si la bilis se acumula en la vescula biliar, esta se inflama. Esta afeccin se puede presentar de manera repentina. La colecistitis es una afeccin grave y requiere Clinical research associate. Cules son las causas? La causa ms frecuente de esta afeccin son los clculos biliares. Los clculos biliares pueden obstruir los conductos (vas biliares) que transportan la bilis desde la vescula biliar. Esto causa la acumulacin de bilis. Algunas otras causas son las siguientes:  Dao a la vescula biliar debido a una disminucin del flujo sanguneo.  Infecciones de los conductos biliares.  Cicatrices y obstrucciones en los conductos biliares.  Tumores en el hgado, el pncreas o la vescula biliar. Qu  incrementa el riesgo? Es ms probable que contraiga esta afeccin si:  Tiene anemia drepanoctica.  Toma pldoras anticonceptivas o [place] estrgeno.  Tiene enfermedad heptica por alcoholismo.  Tiene cirrosis en el hgado.  Recibe alimentacin a travs de una vena (nutricin parenteral).  Est gravemente enfermo.  No come ni bebe durante un tiempo prolongado. Esto se conoce tambin como "ayuno".  Es obeso.  Pierde peso demasiado rpido.  Est embarazada.  Tiene niveles altos de grasas (triglicridos) en Herbalist.  Tiene pancreatitis. Cules son los signos o sntomas? Los sntomas de esta afeccin incluyen:  Dolor en el abdomen, especialmente en la zona superior derecha del abdomen.  Sensibilidad o meteorismo en el abdomen.  Nuseas.  Vmitos.  Cristy Hilts.  Escalofros. Cmo se diagnostica? Esta afeccin se diagnostica mediante los antecedentes mdicos y un examen fsico. Tambin pueden hacerle otras pruebas, incluidas las siguientes:  Pruebas de diagnstico por imgenes, por ejemplo: ? Ecografa de la vescula biliar. ? Exploracin por tomografa computarizada (TC) del abdomen. ? Gammagrafa hepatobiliar con cido iminodiactico (HIDA). Este estudio permite que el mdico observe el trnsito de la bilis desde el hgado a la vescula biliar y Waylan Boga el intestino delgado. ? Una resonancia magntica (RM).  Anlisis de [place], tales como: ? Un hemograma completo. La cantidad de glbulos blancos puede ser ms elevada de lo normal. ? Pruebas funcionales hepticas. Ciertos tipos de clculos biliares hacen que algunos resultados sean ms altos de lo normal. Cmo se trata? El tratamiento puede incluir:  [place] para extirpar la vescula biliar (colecistectoma).  Administracin de antibiticos, generalmente a travs de una va intravenosa.  Hacer ayuno durante cierto tiempo.  Administracin de lquidos intravenosos (i.v.).  Medicamentos para tratar Conservation officer, historic buildings o los  vmitos. Siga estas indicaciones en su casa:  Si le realizaron [place], siga las indicaciones de su mdico acerca del cuidado en el hogar despus del procedimiento. Medicamentos   [employer] de venta libre y los recetados solamente como se lo haya indicado el mdico.  Si le recetaron un antibitico, tmelo como se lo haya indicado el mdico. No deje de tomar el antibitico, aunque comience a sentirse mejor. Indicaciones generales  Siga las indicaciones del mdico respecto de qu comer o beber. Cuando le permitan comer, no coma ni beba nada que desencadene los sntomas.  No levante ningn objeto que pese ms de 10libras (4,5kg) o el lmite de peso que le hayan indicado, hasta que el mdico le diga que puede [place].  No consuma ningn producto que contenga nicotina o tabaco, como cigarrillos y Psychologist, sport and exercise. Si necesita ayuda para dejar de fumar, consulte al mdico.  Concurra a todas las visitas de seguimiento como se lo haya indicado el mdico. Esto es importante.  Comunquese con un mdico si:  El dolor no se alivia con los [employer].  Tiene fiebre. Solicite ayuda de inmediato si:  El dolor se desplaza a otra parte del abdomen o a la espalda.  Sigue teniendo sntomas o tiene sntomas nuevos, incluso con Dispensing optician. Resumen  La colecistitis es la inflamacin de la vescula biliar.  La causa ms frecuente de esta afeccin son los clculos biliares. Los clculos biliares pueden obstruir los conductos (vas biliares) que transportan la bilis desde la vescula biliar.  Los sntomas frecuentes son dolor en el abdomen, nuseas, vmitos, fiebre y escalofros.  Esta afeccin se trata con ciruga para extirpar la vescula biliar, medicamentos, ayuno y administracin de lquidos intravenosos (i.v.).  Siga las indicaciones de su mdico en relacin con las comidas y bebidas. No coma nada que desencadene los sntomas. Esta informacin no tiene Hydrologist el consejo del mdico. Asegrese de hacerle al mdico cualquier pregunta que tenga. Document Revised: 10/01/2017 Document Reviewed: 10/01/2017 Elsevier Patient Education  2020 [employer].

## 2019-12-28 NOTE — Anesthesia Preprocedure Evaluation (Signed)
Anesthesia Evaluation  Patient identified by MRN, date of birth, ID band Patient awake    Reviewed: Allergy & Precautions, NPO status , Patient's Chart, lab work & pertinent test results  History of Anesthesia Complications (+) PONV  Airway Mallampati: II  TM Distance: >3 FB Neck ROM: Full    Dental no notable dental hx.    Pulmonary neg pulmonary ROS,    Pulmonary exam normal breath sounds clear to auscultation       Cardiovascular negative cardio ROS Normal cardiovascular exam Rhythm:Regular Rate:Normal     Neuro/Psych Anxiety negative neurological ROS  negative psych ROS   GI/Hepatic Neg liver ROS, GERD  ,  Endo/Other  negative endocrine ROS  Renal/GU negative Renal ROS  negative genitourinary   Musculoskeletal negative musculoskeletal ROS (+)   Abdominal (+) + obese,   Peds negative pediatric ROS (+)  Hematology negative hematology ROS (+)   Anesthesia Other Findings   Reproductive/Obstetrics negative OB ROS                             Anesthesia Physical Anesthesia Plan  ASA: II  Anesthesia Plan: General   Post-op Pain Management:    Induction: Intravenous  PONV Risk Score and Plan: 4 or greater and Ondansetron, Dexamethasone, Midazolam, Droperidol and Treatment may vary due to age or medical condition  Airway Management Planned: Oral ETT  Additional Equipment:   Intra-op Plan:   Post-operative Plan: Extubation in OR  Informed Consent: I have reviewed the patients History and Physical, chart, labs and discussed the procedure including the risks, benefits and alternatives for the proposed anesthesia with the patient or authorized representative who has indicated his/her understanding and acceptance.     Dental advisory given  Plan Discussed with: CRNA  Anesthesia Plan Comments:         Anesthesia Quick Evaluation

## 2019-12-28 NOTE — Interval H&P Note (Signed)
History and Physical Interval Note:  12/28/2019 7:50 AM  Julie Vazquez  has presented today for surgery, with the diagnosis of SYMPTOMATIC BILIARY COLIC, PROBABLE CHRONIC CHOLECYSTITIS.  The various methods of treatment have been discussed with the patient and family. After consideration of risks, benefits and other options for treatment, the patient has consented to  Procedure(s): [place] CHOLANGIOGRAM, possible needle core biopsy OF LIVER (N/A) as a surgical intervention.  The patient's history has been reviewed, patient examined, no change in status, stable for surgery.  I have reviewed the patient's chart and labs.  Questions were answered to the patient's satisfaction.    I have re-reviewed the the patient's records, history, medications, and allergies.  I have re-examined the patient.  I again discussed intraoperative plans and goals of post-operative recovery.  The patient agrees to proceed.  Leahann Lempke  [birth year] [id number]  Patient Care Team: Ria Bush, MD as PCP - General (Family Medicine) Bary Castilla, Forest Gleason, MD (General Surgery) Ria Bush, MD as Consulting Physician (Family Medicine) Noreene Filbert, MD as Radiation Oncologist (Radiation Oncology)  Patient Active Problem List   Diagnosis Date Noted   Eye lesion 07/19/2019   Person under investigation for COVID-19 05/21/2019   Class 3 severe obesity due to excess calories without serious comorbidity with body mass index (BMI) of 40.0 to 44.9 in adult [facility]) 01/12/2019   Elevated blood-pressure reading without diagnosis of hypertension 01/12/2019   Left medial knee pain 12/05/2016   Keloid 02/05/2015   Obesity, Class II, BMI 35-39.9, no comorbidity 12/24/2012   Vitamin D deficiency    GERD (gastroesophageal reflux disease)    Healthcare maintenance 05/30/2011   Impaired fasting blood sugar 05/30/2011   Dysfunctional gallbladder 05/01/2011   History of  peptic ulcer disease    Positive TB test     Past Medical History:  Diagnosis Date   Anemia    Anxiety    Complication of anesthesia    GERD (gastroesophageal reflux disease)    History of peptic ulcer disease    remote   PONV (postoperative nausea and vomiting)    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 told has this in past.   Vitamin D deficiency     Past Surgical History:  Procedure Laterality Date   CESAREAN SECTION     COLONOSCOPY  09/05/2010   nodular prominence anterior, mobile, likely compression from retoverted uterus   CYST EXCISION N/A    back   MIDDLE EAR SURGERY  2000s    Social History   Socioeconomic History   Marital status: Married    Spouse name: Not on file   Number of children: Not on file   Years of education: Not on file   Highest education level: Not on file  Occupational History   Not on file  Tobacco Use   Smoking status: Never Smoker   Smokeless tobacco: Never Used  Vaping Use   Vaping Use: Never used  Substance and Sexual Activity   Alcohol use: Yes    Alcohol/week: 0.0 standard drinks    Comment: Social   Drug use: No   Sexual activity: Not on file  Other Topics Concern   Not on file  Social History  Narrative   Dominican   Caffeine: 1-2 cups coffee/day   Lives with husband and 2 children   Occupation: Copywriter, advertising   Activity: wants to start going to gym   Diet: bread, fruits/vegetables daily, good water, red meat occasional, 2x/wk fish   Social Determinants of Radio broadcast assistant Strain:    Difficulty of Paying Living Expenses:   Food Insecurity:    Worried About Charity fundraiser in the Last Year:    Arboriculturist in the Last Year:   Transportation Needs:    Lexicographer (Medical):    Lack of Transportation (Non-Medical):   Physical Activity:    Days of Exercise per Week:    Minutes of Exercise per Session:   Stress:    Feeling of Stress :   Social Connections:    Frequency of Communication with Friends and Family:    Frequency of Social Gatherings with Friends and Family:    Attends Religious Services:    Active Member of Clubs or Organizations:    Attends Music therapist:    Marital Status:   Intimate Partner Violence:    Fear of Current or Ex-Partner:    Emotionally Abused:    Physically Abused:    Sexually Abused:     Family History  Problem Relation Age of Onset   Hypertension Mother    Diabetes Mother    Cancer Maternal Aunt        ovarian   Miscarriages / Stillbirths Maternal Uncle    Diabetes Maternal Grandmother    Coronary artery disease Maternal Grandfather        MI   Cancer Cousin        breast   Stroke Maternal Uncle    Stroke Brother    Cancer Sister        breast   Breast cancer Sister 53    Medications Prior to Admission  Medication Sig Dispense Refill Last Dose   Multiple Vitamins-Minerals (MULTIVITAMIN PO) Take 1 tablet by mouth daily.    Past Week at Unknown time   cetirizine (ZYRTEC) 10 MG tablet Take 10 mg by mouth daily as needed for allergies.   More than a month at Unknown time   diclofenac Sodium (VOLTAREN) 1 % GEL Apply 2 g topically 4 (four) times daily. As needed (Patient not taking: Reported on 12/07/2019) 100 g 3 Not Taking at Unknown time   famotidine (PEPCID) 20 MG tablet Take 1 tablet (20 mg total) by mouth at bedtime. (Patient not taking: Reported on 12/07/2019)   Not Taking at Unknown time    Current Facility-Administered Medications  Medication Dose Route Frequency Provider Last Rate Last Admin   bupivacaine liposome (EXPAREL) 1.3 % injection 266 mg  20 mL Infiltration Once Michael Boston, MD       cefTRIAXone (ROCEPHIN) 2 g in sodium chloride 0.9 % 100 mL IVPB  2 g  Intravenous On Call to OR Michael Boston, MD       And   metroNIDAZOLE (FLAGYL) IVPB 500 mg  500 mg Intravenous On Call to OR Michael Boston, MD       Chlorhexidine Gluconate Cloth 2 % PADS 6 each  6 each Topical Once Michael Boston, MD       [START ON 12/29/2019] feeding supplement (ENSURE PRE-SURGERY) liquid 296 mL  296 mL Oral Once Michael Boston, MD       lactated ringers infusion   Intravenous Continuous Nolon Nations, MD  10 mL/hr at 12/28/19 0647 New Bag at 12/28/19 0647     Allergies  Allergen Reactions   Tessalon [Benzonatate] Rash    BP (!) 151/86   Pulse 77   Temp 98.1 F (36.7 C) (Oral)   Resp 17   Ht 5\' 1"  (1.549 m)   Wt 93.4 kg   LMP 02/14/2015   SpO2 99%   BMI 38.91 kg/m   Labs: No results found for this or any previous visit (from the past 48 hour(s)).  Imaging / Studies: No results found.   Adin Hector, M.D., F.A.C.S. Gastrointestinal and Minimally Invasive Surgery [facility] [address] [place], [place] [address] [phone] Main / Paging  12/28/2019 7:50 AM    Adin Hector

## 2019-12-28 NOTE — Transfer of Care (Signed)
Immediate Anesthesia Transfer of Care Note  Patient: Julie Vazquez  Procedure(s) Performed: LAPAROSCOPIC CHOLECYSTECTOMY SINGLE SITE WITH CHOLANGIOGRAM (N/A )  Patient Location: PACU  Anesthesia Type:General  Level of Consciousness: awake, alert  and patient cooperative  Airway & Oxygen Therapy: Patient Spontanous Breathing and Patient connected to face mask oxygen  Post-op Assessment: Report given to RN and Post -op Vital signs reviewed and stable  Post vital signs: Reviewed and stable  Last Vitals:  Vitals Value Taken Time  BP 135/73 12/28/19 1001  Temp    Pulse 103 12/28/19 1002  Resp 21 12/28/19 1002  SpO2 100 % 12/28/19 1002  Vitals shown include unvalidated device data.  Last Pain:  Vitals:   12/28/19 0651  TempSrc: Oral  PainSc:          Complications: No complications documented.

## 2019-12-28 NOTE — Op Note (Signed)
12/28/2019  PATIENT:  Julie Vazquez  54 y.o. female  Patient Care Team: Ria Bush, MD as PCP - General (Family Medicine) Bary Castilla, Forest Gleason, MD (General Surgery) Ria Bush, MD as Consulting Physician (Family Medicine) Noreene Filbert, MD as Radiation Oncologist (Radiation Oncology) Michael Boston, MD as Consulting Physician (General Surgery)  PRE-OPERATIVE DIAGNOSIS:    Chronic Cholecystitis  POST-OPERATIVE DIAGNOSIS:   Chronic Cholecystitis Fatty steatohepatosis  PROCEDURE:  SINGLE SITE Laparoscopic cholecystectomy with intraoperative cholangiogram  SURGEON:  Adin Hector, MD, FACS.  ASSISTANT: OR Staff   ANESTHESIA:    General with endotracheal intubation Local anesthetic as a field block  EBL:  (See Anesthesia Intraoperative Record) Total I/O In: 1100 [I.V.:1000; IV Piggyback:100] Out: -   Delay start of Pharmacological VTE agent (>24hrs) due to surgical blood loss or risk of bleeding:  no  DRAINS: None   SPECIMEN: Gallbladder    DISPOSITION OF SPECIMEN:  PATHOLOGY  COUNTS:  YES  PLAN OF CARE: Discharge to home after PACU  PATIENT DISPOSITION:  PACU - hemodynamically stable.  INDICATION: Pleasant woman with episodes of upper abdominal pain.  Work-up negative for other foregut etiology.  Decreased gallbladder ejection fraction by HIDA scan with reproduction of his symptoms suspicious for chronic acalculous or sludge type cholecystitis.  The anatomy & physiology of hepatobiliary & pancreatic function was discussed.  The pathophysiology of gallbladder dysfunction was discussed.  Natural history risks without surgery was discussed.   I feel the risks of no intervention will lead to serious problems that outweigh the operative risks; therefore, I recommended cholecystectomy to remove the pathology.  I explained laparoscopic techniques with possible need for an open approach.  Probable cholangiogram to evaluate the bilary tract was explained as well.     Risks such as bleeding, infection, abscess, leak, injury to other organs, need for further treatment, heart attack, death, and other risks were discussed.  I noted a good likelihood this will help address the problem.  Possibility that this will not correct all abdominal symptoms was explained.  Goals of post-operative recovery were discussed as well.  We will work to minimize complications.  An educational handout further explaining the pathology and treatment options was given as well.  Questions were answered.  The patient expresses understanding & wishes to proceed with surgery.  OR FINDINGS: Gallbladder thickening with adhesions consistent with chronic irritation.  Narrowed biliary system but no evidence of any choledocholithiasis.  Enlarged liver with some fatty change.  No evidence of cirrhosis.  DESCRIPTION:   The patient was identified & brought in the operating room. The patient was positioned supine with arms tucked. SCDs were active during the entire case. The patient underwent general anesthesia without any difficulty.  The abdomen was prepped and draped in a sterile fashion. A Surgical Timeout confirmed our plan.  I made a transverse curvilinear incision through the superior umbilical fold.  I placed a 53mm long port through the supraumbilical fascia using a modified Hassan cutdown technique with umbilical stalk fascial countertraction. I began carbon dioxide insufflation.  No change in end tidal CO2 measurement.   Camera inspection revealed no injury. There were no adhesions to the anterior abdominal wall supraumbilically.  I proceeded to continue with single site technique. I placed a #5 port in left upper aspect of the wound. I placed a 5 mm atraumatic grasper in the right inferior aspect of the wound.  I turned attention to the right upper quadrant.  Patient had very enlarged liver but was able to  elevate to find the gallbladder.  The gallbladder fundus was elevated cephalad. I  freed adhesions to the ventral surface of the gallbladder off carefully.  I freed the peritoneal coverings between the gallbladder and the liver on the posteriolateral and anteriomedial walls.  Mobilized the gallbladder significantly to try and get it to elevate and come over the liver edge.  Gallbladder did thinned out and there was some spillage of bile but no stones.  Bile aspirated.  I alternated between Harmonic & blunt [place] dissection to help get a good critical view of the cystic artery and cystic duct.  I did further dissection to free 50% of the gallbladder off the liver bed to get a good critical view of the infundibulum and cystic duct. I dissected out the cystic artery; and, after getting a good 360 view, ligated the anterior & posterior branches of the cystic artery close on the infundibulum using the Harmonic ultrasonic dissection.  I skeletonized the cystic duct.  I placed a clip on the infundibulum. I did a partial cystic duct-otomy and ensured patency. I placed a 5 [place] cholangiocatheter through a puncture site at the right subcostal ridge of the abdominal wall and directed it into the cystic duct.  We ran a cholangiogram with dilute radio-opaque contrast and continuous fluoroscopy. Contrast flowed from a side branch consistent with cystic duct cannulization. Contrast flowed up the common hepatic duct into the right and left intrahepatic chains out to secondary radicals. Contrast flowed down the common bile duct easily across the normal ampulla into the duodenum.  This was consistent with a normal cholangiogram.  I removed the cholangiocatheter. I placed clips on the cystic duct x4.  I completed cystic duct transection. I freed the gallbladder from its remaining attachments to the liver. I ensured hemostasis on the gallbladder fossa of the liver and elsewhere. I inspected the rest of the abdomen & detected no injury nor bleeding elsewhere.  I removed the gallbladder out the  supraumbilical fascia. I closed the fascia transversely using #1 PDS interrupted stitches. I closed the skin using 4-0 monocryl stitch.  Because of her history of hypertrophic scarring and possible keloid formation, I done an aggressive field block with local anesthetic containing Kenalog 40 at the right subcostal puncture site as well as her umbilical incision.  I placed Dermabond and sterile dressings to right gentle pressure dressing to hopefully minimize keloid scar formation.  The patient was extubated & arrived in the PACU in stable condition..  I had discussed postoperative care with the patient in the holding area.  I called the patient's husband and got his voicemail.  Left a message.  Postoperative instructions and pain medications are written.  Recommend the dressing stay on until next week to minimize forming of hypertrophic scarring/keloid.   Adin Hector, M.D., F.A.C.S. Gastrointestinal and Minimally Invasive Surgery [facility] [address], [place], [place] [address] [phone] Main / Paging  12/28/2019 9:56 AM

## 2019-12-29 ENCOUNTER — Encounter (HOSPITAL_COMMUNITY): Payer: Self-pay | Admitting: Surgery

## 2019-12-29 LAB — SURGICAL PATHOLOGY

## 2020-01-17 ENCOUNTER — Ambulatory Visit: Payer: 59

## 2020-01-24 ENCOUNTER — Encounter: Payer: 59 | Admitting: Dermatology

## 2020-05-14 ENCOUNTER — Telehealth (INDEPENDENT_AMBULATORY_CARE_PROVIDER_SITE_OTHER): Payer: HRSA Program | Admitting: Family Medicine

## 2020-05-14 ENCOUNTER — Telehealth: Payer: Self-pay | Admitting: Family Medicine

## 2020-05-14 ENCOUNTER — Other Ambulatory Visit: Payer: Self-pay | Admitting: Family Medicine

## 2020-05-14 ENCOUNTER — Encounter: Payer: Self-pay | Admitting: Family Medicine

## 2020-05-14 DIAGNOSIS — U071 COVID-19: Secondary | ICD-10-CM

## 2020-05-14 HISTORY — DX: COVID-19: U07.1

## 2020-05-14 NOTE — Progress Notes (Addendum)
Patient ID: Julie Vazquez, female    DOB: 1965/12/17, 55 y.o.   MRN: [id number]  Virtual visit completed through [employer], a video enabled telemedicine application. Due to national recommendations of social distancing due to COVID-19, a virtual visit is felt to be most appropriate for this patient at this time. Reviewed limitations, risks, security and privacy concerns of performing a virtual visit and the availability of in person appointments. I also reviewed that there may be a patient responsible charge related to this service. The patient agreed to proceed.   Interactive audio and video telecommunications were attempted between myself and Julie Vazquez, however failed due to patient not having access to video capability.  We continued and completed visit with audio only.  Time: 12:39pm - 12:59pm   Patient location: home Provider location: [place] [facility], office Persons participating in this virtual visit: patient, provider   If any vitals were documented, they were collected by patient at home unless specified below.    Ht 5\' 1"  (1.549 m)   Wt 205 lb (93 kg)   LMP 02/14/2015   BMI 38.73 kg/m    CC: COVID infection Subjective:   HPI: Julie Vazquez is a 55 y.o. female presenting on 05/14/2020 for Covid Positive (Test positive on 05/13/2019 home positive test. Became symptomatic on 05/11/2020. Body aches and joint pain. Productive clear/ yellow cough and fever. Taking otc meds )   First day of symptoms: ~05/08/2020 with back ache Tested positive for COVID 05/13/2019.  COVID vaccine J&J 07/2019, had not received booster yet.   Symptoms include back pain, body aches, congestion, productive cough of yellow/clear mucous, feverish feeling, ST. Mild nausea. Mild HA. Ongoing fatigue.   No abd pain, diarrhea, loss of taste/smell.  Today starting to feel better.   Managing symptoms with cepacol and theraflu.  No h/o asthma.   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 Interim medical history since our last visit reviewed. Allergies and medications reviewed and updated. Outpatient Medications Prior to Visit  Medication Sig Dispense Refill  . cetirizine (ZYRTEC) 10 MG tablet Take 10 mg by mouth daily as needed for allergies. (Patient not taking: Reported on 05/14/2020)    . diclofenac Sodium (VOLTAREN) 1 % GEL Apply 2 g topically 4 (four) times daily. As needed (Patient not taking: No sig reported) 100 g 3  . Multiple Vitamins-Minerals (MULTIVITAMIN PO) Take 1 tablet by mouth daily.  (Patient not taking: Reported on 05/14/2020)    . traMADol (ULTRAM) 50 MG tablet Take 1-2 tablets (50-100 mg total) by mouth every 6 (six) hours as needed for moderate pain or severe pain. (Patient not taking: Reported on 05/14/2020) 20 tablet 0   No facility-administered medications prior to visit.     Per HPI unless specifically indicated in ROS section below Review of Systems Objective:  Ht 5\' 1"  (1.549 m)   Wt 205 lb (93 kg)   LMP 02/14/2015   BMI 38.73 kg/m   Wt Readings from Last 3 Encounters:  05/14/20 205 lb (93 kg)  12/28/19 205 lb 14.6 oz (93.4 kg)  12/20/19 206 lb (93.4 kg)       Pulm: speaks in complete sentences without increased work of breathing, sounds congested     Assessment & Plan:   Problem List Items Addressed This Visit    COVID-19 virus  infection    Received J&J vaccine 07/2019. Overall seems stable. Continue supportive care at home.  Unfortunately due to mAb treatment shortage she likely does not qualify for this treatment. Offered to send her info to infusion team for consideration of other treatments (remdesivir outpatient infusion vs new oral antiviral) - she would like to continue monitoring symptoms at home give improvement noted in last 18 hours. Reviewed red flags to suggest development of lower  respiratory tract infection or COVID PNA and need to seek in person evaluation.  Will place on COVID call list to check on later this week. Needs neg COVID test to return to work - I have asked her to come in Wed for COVID swab. If negative and symptoms improving, should be ok to return to work. If positive, would have her quarantine for full 10 days with option to return to work after this, without need to retest.           No orders of the defined types were placed in this encounter.  No orders of the defined types were placed in this encounter.   I discussed the assessment and treatment plan with the patient. The patient was provided an opportunity to ask questions and all were answered. The patient agreed with the plan and demonstrated an understanding of the instructions. The patient was advised to call back or seek an in-person evaluation if the symptoms worsen or if the condition fails to improve as anticipated.  Follow up plan: No follow-ups on file.  Eustaquio Boyden, MD

## 2020-05-14 NOTE — Assessment & Plan Note (Addendum)
Received J&J vaccine 07/2019. Overall seems stable. Continue supportive care at home.  Unfortunately due to mAb treatment shortage she likely does not qualify for this treatment. Offered to send her info to infusion team for consideration of other treatments (remdesivir outpatient infusion vs new oral antiviral) - she would like to continue monitoring symptoms at home give improvement noted in last 18 hours. Reviewed red flags to suggest development of lower respiratory tract infection or COVID PNA and need to seek in person evaluation.  Will place on COVID call list to check on later this week. Needs neg COVID test to return to work - I have asked her to come in Wed for COVID swab. If negative and symptoms improving, should be ok to return to work. If positive, would have her quarantine for full 10 days with option to return to work after this, without need to retest.

## 2020-05-14 NOTE — Telephone Encounter (Signed)
COVID positive patient. plz call Wed for update on symptoms. If feeling better, may stop following.

## 2020-05-16 ENCOUNTER — Other Ambulatory Visit (INDEPENDENT_AMBULATORY_CARE_PROVIDER_SITE_OTHER): Payer: Self-pay

## 2020-05-16 DIAGNOSIS — U071 COVID-19: Secondary | ICD-10-CM

## 2020-05-16 NOTE — Telephone Encounter (Signed)
Noted. Thanks.

## 2020-05-16 NOTE — Telephone Encounter (Signed)
I spoke with patient & she stated that she was feeling better just tired. No fevers, no cough, no SOB. I advised that she still quarantine since she stated she just had another test done today your office.

## 2020-05-18 LAB — SPECIMEN STATUS REPORT

## 2020-05-18 LAB — NOVEL CORONAVIRUS, NAA: SARS-CoV-2, NAA: NOT DETECTED

## 2020-05-18 LAB — SARS-COV-2, NAA 2 DAY TAT

## 2020-07-19 ENCOUNTER — Other Ambulatory Visit: Payer: Self-pay | Admitting: Family Medicine

## 2020-07-19 DIAGNOSIS — E1169 Type 2 diabetes mellitus with other specified complication: Secondary | ICD-10-CM | POA: Insufficient documentation

## 2020-07-19 DIAGNOSIS — R7303 Prediabetes: Secondary | ICD-10-CM | POA: Insufficient documentation

## 2020-07-19 DIAGNOSIS — E559 Vitamin D deficiency, unspecified: Secondary | ICD-10-CM

## 2020-07-20 ENCOUNTER — Other Ambulatory Visit (INDEPENDENT_AMBULATORY_CARE_PROVIDER_SITE_OTHER): Payer: 59

## 2020-07-20 ENCOUNTER — Other Ambulatory Visit: Payer: Self-pay

## 2020-07-20 DIAGNOSIS — E559 Vitamin D deficiency, unspecified: Secondary | ICD-10-CM

## 2020-07-20 DIAGNOSIS — R7303 Prediabetes: Secondary | ICD-10-CM

## 2020-07-20 LAB — COMPREHENSIVE METABOLIC PANEL
ALT: 18 U/L (ref 0–35)
AST: 17 U/L (ref 0–37)
Albumin: 4 g/dL (ref 3.5–5.2)
Alkaline Phosphatase: 92 U/L (ref 39–117)
BUN: 17 mg/dL (ref 6–23)
CO2: 26 mEq/L (ref 19–32)
Calcium: 9.4 mg/dL (ref 8.4–10.5)
Chloride: 102 mEq/L (ref 96–112)
Creatinine, Ser: 0.74 mg/dL (ref 0.40–1.20)
GFR: 90.79 mL/min (ref >60.00)
Glucose, Bld: 116 mg/dL — ABNORMAL HIGH (ref 70–99)
Potassium: 4.4 mEq/L (ref 3.5–5.1)
Sodium: 138 mEq/L (ref 135–145)
Total Bilirubin: 0.3 mg/dL (ref 0.2–1.2)
Total Protein: 7.6 g/dL (ref 6.0–8.3)

## 2020-07-20 LAB — LIPID PANEL
Cholesterol: 167 mg/dL (ref 0–200)
HDL: 81.3 mg/dL (ref >39.00)
LDL Cholesterol: 71 mg/dL (ref 0–99)
NonHDL: 85.79
Total CHOL/HDL Ratio: 2
Triglycerides: 76 mg/dL (ref 0.0–149.0)
VLDL: 15.2 mg/dL (ref 0.0–40.0)

## 2020-07-20 LAB — VITAMIN D 25 HYDROXY (VIT D DEFICIENCY, FRACTURES): VITD: 30.72 ng/mL (ref 30.00–100.00)

## 2020-07-20 LAB — HEMOGLOBIN A1C: Hgb A1c MFr Bld: 6.3 % (ref 4.6–6.5)

## 2020-07-27 ENCOUNTER — Other Ambulatory Visit: Payer: Self-pay

## 2020-07-27 ENCOUNTER — Encounter: Payer: Self-pay | Admitting: Family Medicine

## 2020-07-27 ENCOUNTER — Ambulatory Visit (INDEPENDENT_AMBULATORY_CARE_PROVIDER_SITE_OTHER): Payer: 59 | Admitting: Family Medicine

## 2020-07-27 VITALS — BP 120/72 | HR 90 | Temp 97.8°F | Ht 60.0 in | Wt 207.6 lb

## 2020-07-27 DIAGNOSIS — Z0000 Encounter for general adult medical examination without abnormal findings: Secondary | ICD-10-CM

## 2020-07-27 DIAGNOSIS — E559 Vitamin D deficiency, unspecified: Secondary | ICD-10-CM | POA: Diagnosis not present

## 2020-07-27 DIAGNOSIS — Z1211 Encounter for screening for malignant neoplasm of colon: Secondary | ICD-10-CM

## 2020-07-27 DIAGNOSIS — K828 Other specified diseases of gallbladder: Secondary | ICD-10-CM | POA: Diagnosis not present

## 2020-07-27 DIAGNOSIS — K219 Gastro-esophageal reflux disease without esophagitis: Secondary | ICD-10-CM

## 2020-07-27 DIAGNOSIS — R7303 Prediabetes: Secondary | ICD-10-CM

## 2020-07-27 MED ORDER — VITAMIN D 50 MCG (2000 UT) PO CAPS: 1.0000 | ORAL_CAPSULE | Freq: Every day | ORAL | Status: DC

## 2020-07-27 NOTE — Assessment & Plan Note (Addendum)
Preventative protocols reviewed and updated unless pt declined. Discussed healthy diet and lifestyle.  Declines tetanus and shingrix vaccines, will return for this.

## 2020-07-27 NOTE — Assessment & Plan Note (Signed)
Encouraged limiting added sugars in diet.  

## 2020-07-27 NOTE — Progress Notes (Signed)
Patient ID: Julie Vazquez, female    DOB: 01-08-1966, 55 y.o.   MRN: [id number]  This visit was conducted in person.  BP 120/72   Pulse 90   Temp 97.8 F (36.6 C) (Temporal)   Ht 5' (1.524 m)   Wt 207 lb 9 oz (94.1 kg)   LMP 02/14/2015   SpO2 97%   BMI 40.54 kg/m    CC: CPE Subjective:   HPI: Julie Vazquez is a 55 y.o. female presenting on 07/27/2020 for Annual Exam   She had lap chole for biliary dysfunction with improvement - notes difficulty tolerating greasy foods.  Started doing weight resistance training. 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 Discussed - would like rpt colonoscopy.  Well woman exam yearly with OBGYN (Dr Orvan Seen), normal pap smears 2021. H/o fibroids,sees GYN for this.  Mammogram - dense tissue rec yearly with GYN.  LMP - years ago Flu shot - declines  COVID vaccine J&J 07/2019  Td 2007, due, declines Shingrix - discussed, defers for now  Seat belt use discussed.  Sunscreen use discussed. No changing moles on skin. Non smoker Alcohol - seldom   [place] Caffeine: 1-2 cups coffee/day Lives with husband and 2 children Occupation: Copywriter, advertising Activity: 3-5x/wk cardio and weights (Physiological scientist) - restarted regular exercise routine   Diet: fruits/vegetables daily, good water, red meat occasional, 2x/wk fish, avoids sugar and carbs     Relevant past medical, surgical, family and social history reviewed and updated as indicated. Interim medical history since our last visit reviewed. Allergies and medications reviewed and updated. Outpatient Medications Prior to Visit  Medication Sig Dispense Refill  . cetirizine (ZYRTEC) 10 MG tablet Take 10 mg by mouth daily as needed for allergies.    . Multiple Vitamins-Minerals (MULTIVITAMIN PO) Take 1 tablet by mouth daily.    . diclofenac Sodium (VOLTAREN) 1 % GEL Apply  2 g topically 4 (four) times daily. As needed (Patient not taking: No sig reported) 100 g 3  . traMADol (ULTRAM) 50 MG tablet Take 1-2 tablets (50-100 mg total) by mouth every 6 (six) hours as needed for moderate pain or severe pain. (Patient not taking: Reported on 05/14/2020) 20 tablet 0   No facility-administered medications prior to visit.     Per HPI unless specifically indicated in ROS section below Review of Systems  Constitutional: Positive for fatigue. Negative for activity change, appetite change, chills, fever and unexpected weight change.  HENT: Negative for hearing loss.   Eyes: Negative for visual disturbance.  Respiratory: Positive for shortness of breath (since covid). Negative for cough, chest tightness and wheezing.   Cardiovascular: Negative for chest pain, palpitations and leg swelling.  Gastrointestinal: Negative for abdominal distention, abdominal pain, blood in stool, constipation, diarrhea, nausea and vomiting.  Genitourinary: Negative for difficulty urinating and hematuria.  Musculoskeletal: Negative for arthralgias, myalgias and neck pain.  Skin: Negative for rash.  Neurological: Negative for dizziness, seizures, syncope and headaches.  Hematological: Negative for adenopathy. Does not bruise/bleed easily.  Psychiatric/Behavioral: Negative for dysphoric mood. The patient is not nervous/anxious.    Objective:  BP 120/72   Pulse 90   Temp 97.8 F (36.6 C) (Temporal)   Ht 5' (1.524 m)   Wt 207 lb 9 oz (94.1 kg)   LMP 02/14/2015   SpO2 97%   BMI 40.54 kg/m   Wt Readings from Last 3 Encounters:  07/27/20 207  lb 9 oz (94.1 kg)  05/14/20 205 lb (93 kg)  12/28/19 205 lb 14.6 oz (93.4 kg)      Physical Exam Vitals and nursing note reviewed.  Constitutional:      General: She is not in acute distress.    Appearance: Normal appearance. She is well-developed. She is obese. She is not ill-appearing.  HENT:     Head: Normocephalic and atraumatic.     Right Ear:  Hearing, tympanic membrane, ear canal and external ear normal.     Left Ear: Hearing, tympanic membrane, ear canal and external ear normal.     Mouth/Throat:     Pharynx: Uvula midline.  Eyes:     General: No scleral icterus.    Extraocular Movements: Extraocular movements intact.     Conjunctiva/sclera: Conjunctivae normal.     Pupils: Pupils are equal, round, and reactive to light.  Cardiovascular:     Rate and Rhythm: Normal rate and regular rhythm.     Pulses: Normal pulses.          Radial pulses are 2+ on the right side and 2+ on the left side.     Heart sounds: Normal heart sounds. No murmur heard.   Pulmonary:     Effort: Pulmonary effort is normal. No respiratory distress.     Breath sounds: Normal breath sounds. No wheezing, rhonchi or rales.  Abdominal:     General: Bowel sounds are normal. There is no distension.     Palpations: Abdomen is soft. There is no mass.     Tenderness: There is no abdominal tenderness. There is no guarding or rebound.     Hernia: No hernia is present.  Musculoskeletal:        General: Normal range of motion.     Cervical back: Normal range of motion and neck supple.     Right lower leg: No edema.     Left lower leg: No edema.  Lymphadenopathy:     Cervical: No cervical adenopathy.  Skin:    General: Skin is warm and dry.     Findings: No rash.  Neurological:     General: No focal deficit present.     Mental Status: She is alert and oriented to person, place, and time.     Comments: CN grossly intact, station and gait intact  Psychiatric:        Mood and Affect: Mood normal.        Behavior: Behavior normal.        Thought Content: Thought content normal.        Judgment: Judgment normal.       Results for orders placed or performed in visit on 07/20/20  VITAMIN D 25 Hydroxy (Vit-D Deficiency, Fractures)  Result Value Ref Range   VITD 30.72 30.00 - 100.00 ng/mL  Hemoglobin A1c  Result Value Ref Range   Hgb A1c MFr Bld 6.3 4.6 -  6.5 %  Comprehensive metabolic panel  Result Value Ref Range   Sodium 138 135 - 145 mEq/L   Potassium 4.4 3.5 - 5.1 mEq/L   Chloride 102 96 - 112 mEq/L   CO2 26 19 - 32 mEq/L   Glucose, Bld 116 (H) 70 - 99 mg/dL   BUN 17 6 - 23 mg/dL   Creatinine, Ser 0.74 0.40 - 1.20 mg/dL   Total Bilirubin 0.3 0.2 - 1.2 mg/dL   Alkaline Phosphatase 92 39 - 117 U/L   AST 17 0 - 37 U/L  ALT 18 0 - 35 U/L   Total Protein 7.6 6.0 - 8.3 g/dL   Albumin 4.0 3.5 - 5.2 g/dL   GFR 90.79 >60.00 mL/min   Calcium 9.4 8.4 - 10.5 mg/dL  Lipid panel  Result Value Ref Range   Cholesterol 167 0 - 200 mg/dL   Triglycerides 76.0 0.0 - 149.0 mg/dL   HDL 81.30 >39.00 mg/dL   VLDL 15.2 0.0 - 40.0 mg/dL   LDL Cholesterol 71 0 - 99 mg/dL   Total CHOL/HDL Ratio 2    NonHDL 85.79    Assessment & Plan:  This visit occurred during the SARS-CoV-2 public health emergency.  Safety protocols were in place, including screening questions prior to the visit, additional usage of staff PPE, and extensive cleaning of exam room while observing appropriate contact time as indicated for disinfecting solutions.   Problem List Items Addressed This Visit    Dysfunctional gallbladder    S/p cholecystectomy, symptoms largely improved. Notes ongoing intolerance to greasy foods.      Healthcare maintenance - Primary    Preventative protocols reviewed and updated unless pt declined. Discussed healthy diet and lifestyle.  Declines tetanus and shingrix vaccines, will return for this.       Vitamin D deficiency    rec increase vit D to 2000 IU daily.       Obesity, morbid, BMI 40.0-49.9 ([place])    Encouraged ongoing efforts for healthy diet and lifestyle choices to affect sustainable weight loss.       GERD (gastroesophageal reflux disease)    This has improved since lap chole.       Prediabetes    Encouraged limiting added sugars in diet.        Other Visit Diagnoses    Special screening for malignant neoplasms, colon        Relevant Orders   Ambulatory referral to Gastroenterology       Meds ordered this encounter  Medications  . Cholecalciferol (VITAMIN D) 50 MCG (2000 UT) CAPS    Sig: Take 1 capsule (2,000 Units total) by mouth daily.    Dispense:  30 capsule   Orders Placed This Encounter  Procedures  . Ambulatory referral to Gastroenterology    Referral Priority:   Routine    Referral Type:   Consultation    Referral Reason:   Specialty Services Required    Number of Visits Requested:   1    Patient instructions: Considere vacuna contra shingles (shingrix). Puede llamarnos para hacer visita con enferma.  [employer]/gallery conservator.  Debroah Baller dosis de vitamin D a 2000 unidades diarias.  Gusto verla hoy. Regresar en 1 ao para proximo examen fisico.   Follow up plan: Return in about 1 year (around 07/27/2021) for annual exam, prior fasting for blood work.  Ria Bush, MD

## 2020-07-27 NOTE — Assessment & Plan Note (Signed)
rec increase vit D to 2000 IU daily.

## 2020-07-27 NOTE — Patient Instructions (Addendum)
Considere vacuna contra shingles (shingrix). Puede llamarnos para hacer visita con enferma.  [employer].  Debroah Baller dosis de vitamin D a 2000 unidades diarias.  Gusto verla hoy. Regresar en 1 ao para proximo examen fisico.   Health Maintenance for Postmenopausal Women Menopause is a normal process in which your ability to get pregnant comes to an end. This process happens slowly over many months or years, usually between the ages of 46 and 53. Menopause is complete when you have missed your menstrual periods for 12 months. It is important to talk with your health care provider about some of the most common conditions that affect women after menopause (postmenopausal women). These include heart disease, cancer, and bone loss (osteoporosis). Adopting a healthy lifestyle and getting preventive care can help to promote your health and wellness. The actions you take can also lower your chances of developing some of these common conditions. What should I know about menopause? During menopause, you may get a number of symptoms, such as:  Hot flashes. These can be moderate or severe.  Night sweats.  Decrease in sex drive.  Mood swings.  Headaches.  Tiredness.  Irritability.  Memory problems.  Insomnia. Choosing to treat or not to treat these symptoms is a decision that you make with your health care provider. Do I need hormone replacement therapy?  Hormone replacement therapy is effective in treating symptoms that are caused by menopause, such as hot flashes and night sweats.  Hormone replacement carries certain risks, especially as you become older. If you are thinking about using estrogen or estrogen with progestin, discuss the benefits and risks with your health care provider. What is my risk for heart disease and stroke? The risk of heart disease, heart attack, and stroke increases as you age. One of the causes may be a change in the body's hormones during menopause.  This can affect how your body uses dietary fats, triglycerides, and cholesterol. Heart attack and stroke are medical emergencies. There are many things that you can do to help prevent heart disease and stroke. Watch your blood pressure  High blood pressure causes heart disease and increases the risk of stroke. This is more likely to develop in people who have high blood pressure readings, are of African descent, or are overweight.  Have your blood pressure checked: ? Every 3-5 years if you are 63-45 years of age. ? Every year if you are 34 years old or older. Eat a healthy diet  Eat a diet that includes plenty of vegetables, fruits, low-fat dairy products, and lean protein.  Do not eat a lot of foods that are high in solid fats, added sugars, or sodium.   Get regular exercise Get regular exercise. This is one of the most important things you can do for your health. Most adults should:  Try to exercise for at least 150 minutes each week. The exercise should increase your heart rate and make you sweat (moderate-intensity exercise).  Try to do strengthening exercises at least twice each week. Do these in addition to the moderate-intensity exercise.  Spend less time sitting. Even light physical activity can be beneficial. Other tips  Work with your health care provider to achieve or maintain a healthy weight.  Do not use any products that contain nicotine or tobacco, such as cigarettes, e-cigarettes, and chewing tobacco. If you need help quitting, ask your health care provider.  Know your numbers. Ask your health care provider to check your cholesterol and your blood  sugar (glucose). Continue to have your blood tested as directed by your health care provider. Do I need screening for cancer? Depending on your health history and family history, you may need to have cancer screening at different stages of your life. This may include screening for:  Breast cancer.  Cervical cancer.  Lung  cancer.  Colorectal cancer. What is my risk for osteoporosis? After menopause, you may be at increased risk for osteoporosis. Osteoporosis is a condition in which bone destruction happens more quickly than new bone creation. To help prevent osteoporosis or the bone fractures that can happen because of osteoporosis, you may take the following actions:  If you are 49-75 years old, get at least 1,000 mg of calcium and at least 600 mg of vitamin D per day.  If you are older than age 77 but younger than age 61, get at least 1,200 mg of calcium and at least 600 mg of vitamin D per day.  If you are older than age 59, get at least 1,200 mg of calcium and at least 800 mg of vitamin D per day. Smoking and drinking excessive alcohol increase the risk of osteoporosis. Eat foods that are rich in calcium and vitamin D, and do weight-bearing exercises several times each week as directed by your health care provider. How does menopause affect my mental health? Depression may occur at any age, but it is more common as you become older. Common symptoms of depression include:  Low or sad mood.  Changes in sleep patterns.  Changes in appetite or eating patterns.  Feeling an overall lack of motivation or enjoyment of activities that you previously enjoyed.  Frequent crying spells. Talk with your health care provider if you think that you are experiencing depression. General instructions See your health care provider for regular wellness exams and vaccines. This may include:  Scheduling regular health, dental, and eye exams.  Getting and maintaining your vaccines. These include: ? Influenza vaccine. Get this vaccine each year before the flu season begins. ? Pneumonia vaccine. ? Shingles vaccine. ? Tetanus, diphtheria, and pertussis (Tdap) booster vaccine. Your health care provider may also recommend other immunizations. Tell your health care provider if you have ever been abused or do not feel safe at  home. Summary  Menopause is a normal process in which your ability to get pregnant comes to an end.  This condition causes hot flashes, night sweats, decreased interest in sex, mood swings, headaches, or lack of sleep.  Treatment for this condition may include hormone replacement therapy.  Take actions to keep yourself healthy, including exercising regularly, eating a healthy diet, watching your weight, and checking your blood pressure and blood sugar levels.  Get screened for cancer and depression. Make sure that you are up to date with all your vaccines. This information is not intended to replace advice given to you by your health care provider. Make sure you discuss any questions you have with your health care provider. Document Revised: 04/21/2018 Document Reviewed: 04/21/2018 Elsevier Patient Education  2021 [employer].

## 2020-07-27 NOTE — Assessment & Plan Note (Signed)
This has improved since lap chole.

## 2020-07-27 NOTE — Assessment & Plan Note (Signed)
Encouraged ongoing efforts for healthy diet and lifestyle choices to affect sustainable weight loss.

## 2020-07-27 NOTE — Assessment & Plan Note (Addendum)
S/p cholecystectomy, symptoms largely improved. Notes ongoing intolerance to greasy foods.

## 2020-10-23 IMAGING — MG MM DIGITAL DIAGNOSTIC UNILAT*L* W/ TOMO W/ CAD
6 of 10 series · 6 of 30 positions shown · non-contrast
Comparison: Previous exam(s).

CLINICAL DATA: Patient recalled from screening for left breast
asymmetry.

EXAM:
DIGITAL DIAGNOSTIC LEFT MAMMOGRAM WITH CAD AND TOMO
ULTRASOUND LEFT BREAST

[L CC synth-2D (1 of 4)]
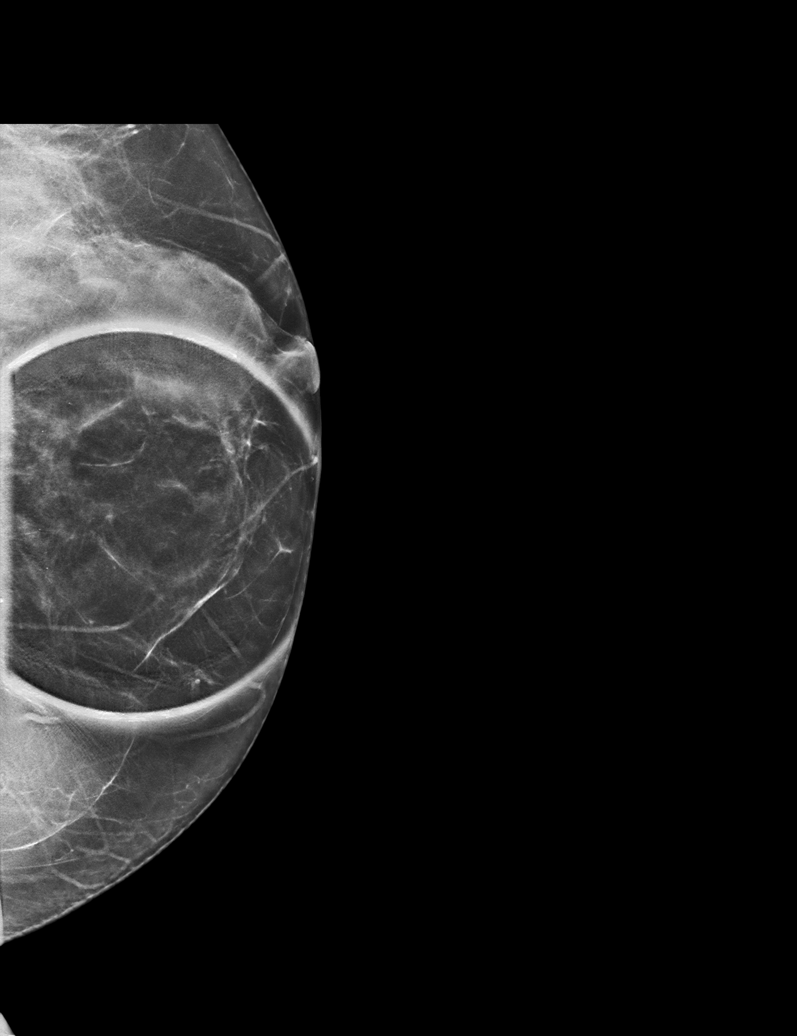

[L CC synth-2D (2 of 4)]
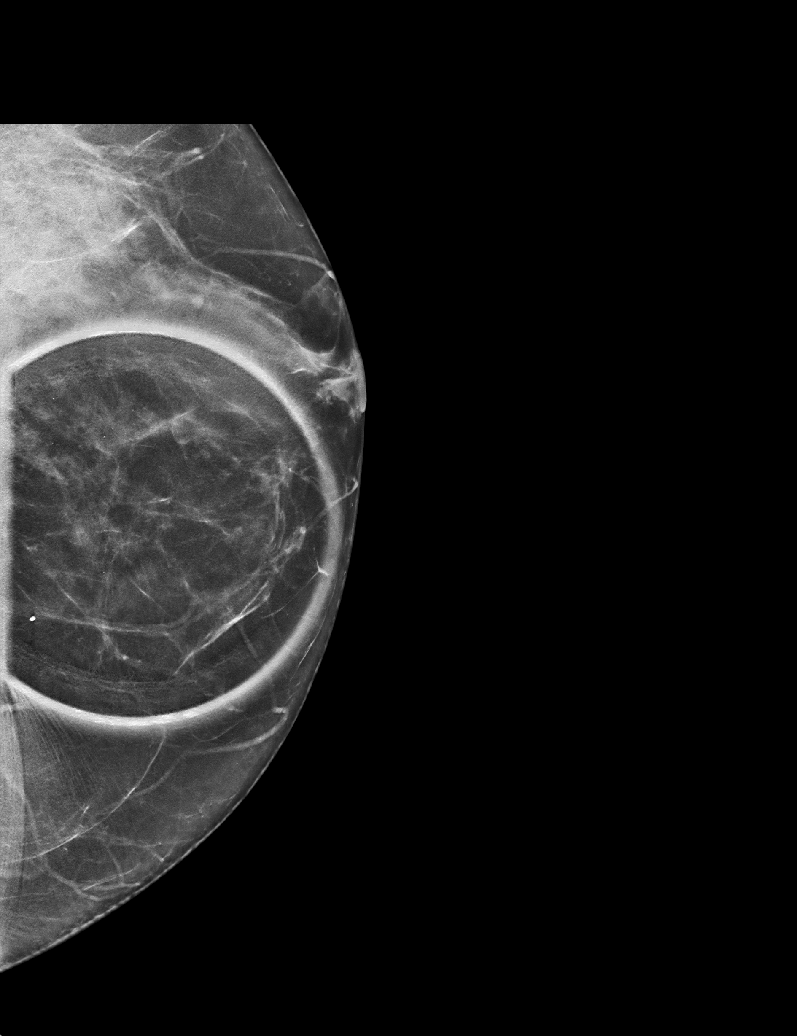

[L CC synth-2D (3 of 4)]
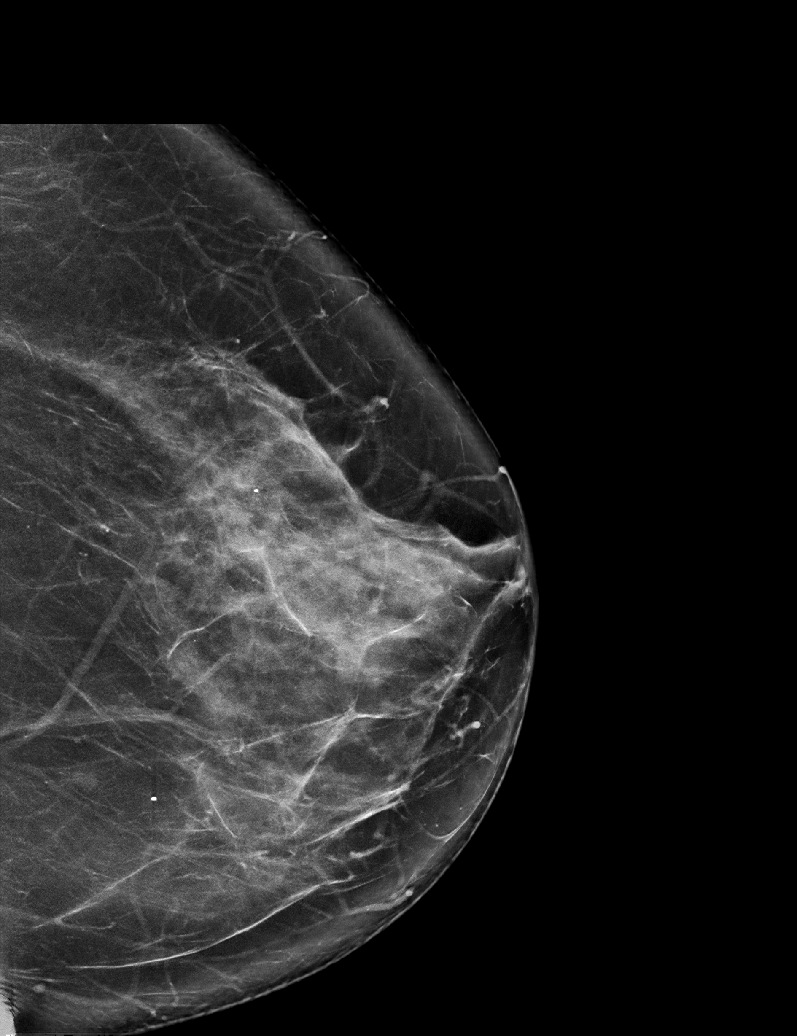

[L ML synth-2D]
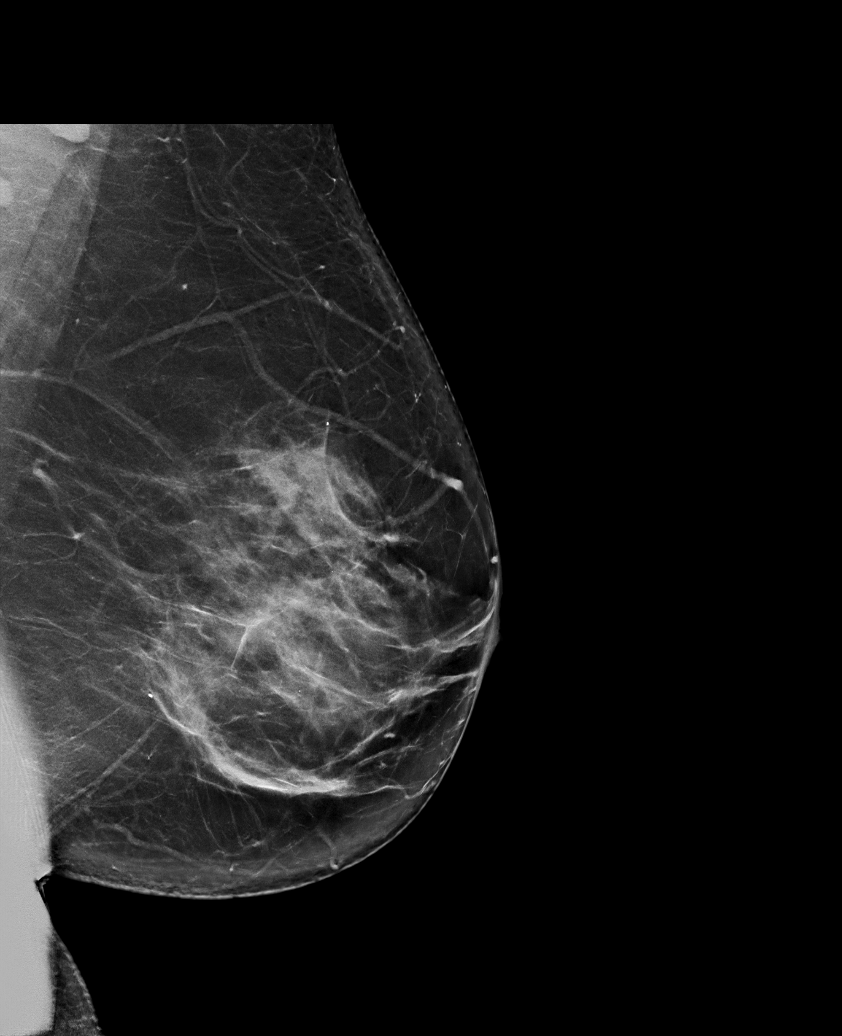

[L CC synth-2D (4 of 4)]
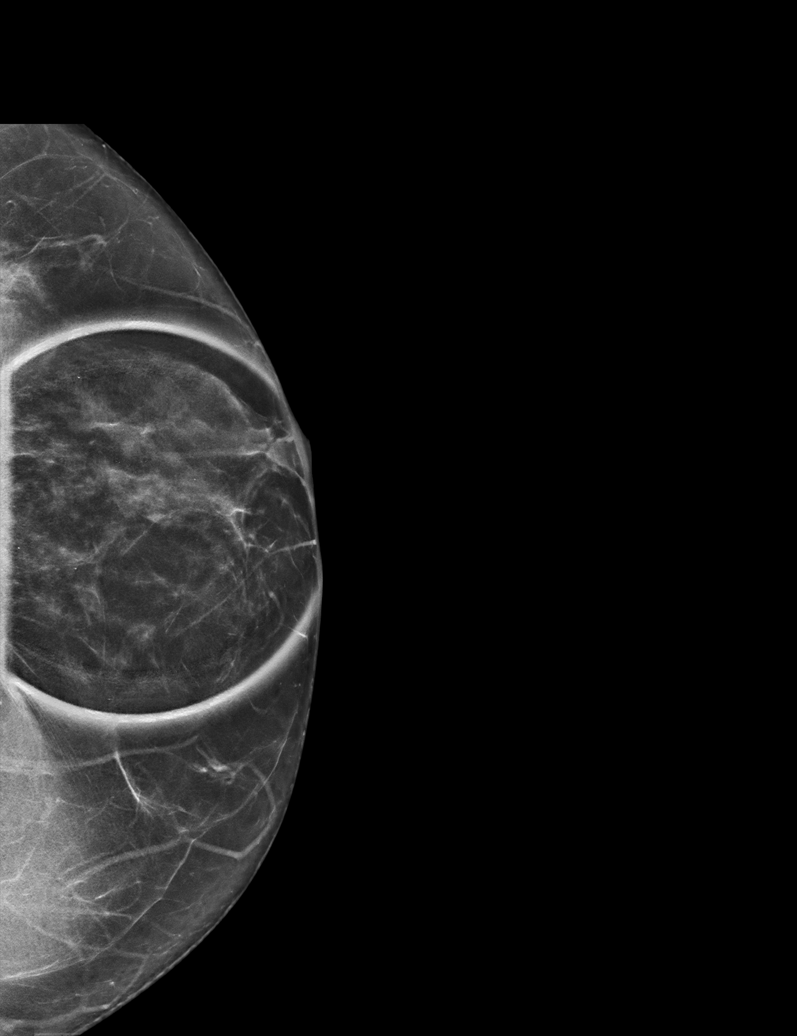

[L ML tomo · tomo slice 45/89.0]
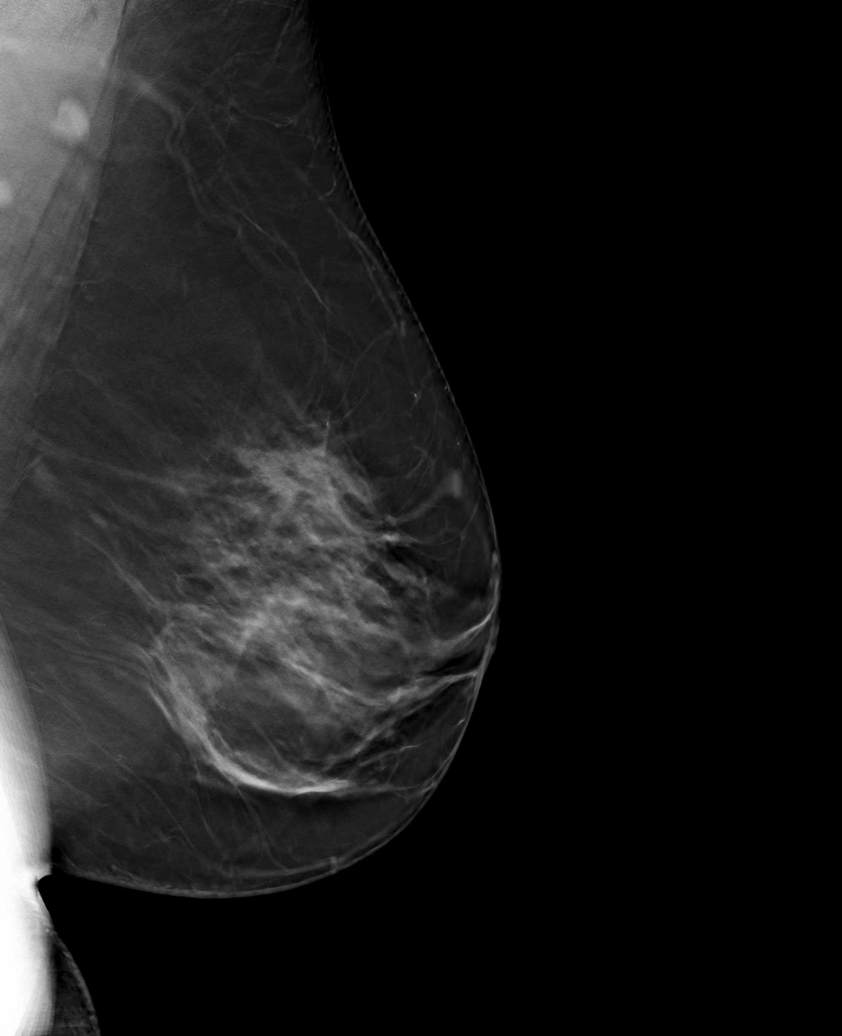

[6 of 30 positions shown; findings below may reference images not displayed]

ACR Breast Density Category c: The breast tissue is heterogeneously
dense, which may obscure small masses.
FINDINGS: Questioned asymmetry within the inferior left breast on the cc view
partially effaced with additional imaging suggestive of dense
fibroglandular tissue.

Mammographic images were processed with CAD.

Targeted ultrasound is performed, showing normal tissue without
suspicious mass within the inferior left breast.
IMPRESSION: No mammographic evidence for malignancy.

RECOMMENDATION:
Screening mammogram in one year.(Code:[id number])

I have discussed the findings and recommendations with the patient.
If applicable, a reminder letter will be sent to the patient
regarding the next appointment.

BI-RADS CATEGORY  2: Benign.

## 2020-12-30 ENCOUNTER — Encounter: Payer: Self-pay | Admitting: Internal Medicine

## 2021-02-26 LAB — RESULTS CONSOLE HPV: CHL HPV: NEGATIVE

## 2021-02-26 LAB — HM PAP SMEAR: HM Pap smear: NORMAL

## 2021-05-12 HISTORY — PX: GLAUCOMA SURGERY: SHX656

## 2021-05-21 LAB — HM MAMMOGRAPHY

## 2021-05-22 ENCOUNTER — Telehealth: Payer: Self-pay | Admitting: Family Medicine

## 2021-05-22 ENCOUNTER — Other Ambulatory Visit: Payer: Self-pay

## 2021-05-22 ENCOUNTER — Ambulatory Visit (INDEPENDENT_AMBULATORY_CARE_PROVIDER_SITE_OTHER): Payer: 59 | Admitting: Family Medicine

## 2021-05-22 ENCOUNTER — Encounter: Payer: Self-pay | Admitting: Family Medicine

## 2021-05-22 DIAGNOSIS — J209 Acute bronchitis, unspecified: Secondary | ICD-10-CM | POA: Diagnosis not present

## 2021-05-22 MED ORDER — PREDNISONE 20 MG PO TABS: ORAL_TABLET | ORAL | 0 refills | Status: DC

## 2021-05-22 MED ORDER — CHERATUSSIN AC 100-10 MG/5ML PO SOLN: 5.0000 mL | Freq: Two times a day (BID) | ORAL | 0 refills | Status: DC | PRN

## 2021-05-22 NOTE — Telephone Encounter (Signed)
Per chart review tab pt has already been seen by Dr Darnell Level today. Sending note to Dr Darnell Level and Lattie Haw CMA.     [facility] - Client TELEPHONE ADVICE RECORD [facility] Patient Name: Julie Vazquez Gender: Female DOB: 10/28/1965 Age: 56 Y 54 M 16 D Return Phone Number: [phone] (Primary) Address[place]  [address] Client [place] [facility] - Client Client Site [place] Provider Ria Bush - MD Contact Type Call Who Is Calling Patient / Member / Family / Caregiver Call Type Triage / Clinical Relationship To Patient Self Return Phone Number [phone] (Primary) Chief Complaint Cough Reason for Call Symptomatic / Request for Health Information Initial Comment Caller states is coughing for two months and tried over the counter not working. Location confirmed. Translation No Disp. Time Eilene Ghazi Time) Disposition Final User 05/22/2021 1:05:50 PM Attempt made - message left Standifer, RN, Nira Conn 05/22/2021 1:14:43 PM Attempt made - no message left Standifer, RN, Nira Conn 05/22/2021 1:26:46 PM FINAL ATTEMPT MADE - no message left Yes Standifer, RN, Heathe

## 2021-05-22 NOTE — Telephone Encounter (Signed)
Pt stated she would like to talk to dr g. Or lisa she has been coughing but don't have covid . I tried to schedule pt with another provider but she don't want to

## 2021-05-22 NOTE — Telephone Encounter (Signed)
Spoke to patient by telephone and was advised that she started with symptoms 9 days ago. Patient stated that she had a fever for one day. Patient stated that she has a productive cough/white-yellow. Patient stated that her chest and back hurts when she coughs. Patient stated that she has done 3 home covid test and all have been negative. Patient stated the last covid test that she did was 3 days ago. Patient stated that she just needs something to help with her cough. Patient denies SOB or difficulty breathing except when she coughs. Patient stated that she can make an appointment with Dr. Danise Mina today 05/22/21 at 3:30 pm at [facility]. Address was given to patient.

## 2021-05-22 NOTE — Patient Instructions (Addendum)
Creo que su tos viene de inflamacion despues de infeccion viral.  Tome jarabe con codeina para la tos - majormente de noche.  Tome pastilla prednisona para inflamacion.  Julie Vazquez agua, liquidos, descanso.  Dejenos saber si no mejora con esto.

## 2021-05-22 NOTE — Telephone Encounter (Deleted)
Per appt note pt has already seen Dr Darnell Level today. Sending note to Dr Ferdinand Lango [facility] Day - Client TELEPHONE ADVICE RECORD [facility] Patient Name: Julie Vazquez Gender: Female DOB: August 05, 1965 Age: 56 Y 34 M 16 D Return Phone Number: [phone] (Primary) Address: [place]  [address] Client [place] [facility] - Client Client Site [place] Provider Ria Bush - MD Contact Type Call Who Is Calling Patient / Member / Family / Caregiver Call Type Triage / Clinical Relationship To Patient Self Return Phone Number [phone] (Primary) Chief Complaint Cough Reason for Call Symptomatic / Request for Health Information Initial Comment Caller states is coughing for two months and tried over the counter not working. Location confirmed. Translation No Disp. Time Eilene Ghazi Time) Disposition Final User 05/22/2021 1:05:50 PM Attempt made - message left Standifer, RN, Nira Conn 05/22/2021 1:14:43 PM Attempt made - no message left Standifer, RN, Nira Conn 05/22/2021 1:26:46 PM FINAL ATTEMPT MADE - no message left Yes Standifer, RN, Heathe

## 2021-05-22 NOTE — Progress Notes (Signed)
Patient ID: Julie Vazquez, female    DOB: Nov 29, 1965, 56 y.o.   MRN: [id number]  This visit was conducted in person.  BP 118/66    Pulse (!) 102    Temp 98 F (36.7 C) (Temporal)    Ht 5' (1.524 m)    Wt 214 lb 7 oz (97.3 kg)    LMP 02/14/2015    SpO2 97%    BMI 41.88 kg/m    CC: cough Subjective:   HPI: Julie Vazquez is a 56 y.o. female presenting on 05/22/2021 for Cough (C/o prod cough with yellowish-white phlegm.  Also, c/o chest and back pain with cough. Cough is worse at night but it better than it was. Started 05/13/20.  Had 3 neg home COVID tests. Tried Robitussin, helpful. )   10d h/o productive cough of colored phlegm associated with chest and back ache with cough. Cough worse at night time. + coughing fits can come on with talking or activity. Initial fever, none since. Some nasal congestion. Decreased appetite. Cough has only mildly improved. Chest > head congestion.   No chills, ST, abd pain, nausea, diarrhea, dyspnea or wheezing.  No h/o asthma  Tested negative for COVID x3 at home.  Treating with robitussin with benefit.   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      Relevant past medical, surgical, family and social history reviewed and updated as indicated. Interim medical history since our last visit reviewed. Allergies and medications reviewed and updated. Outpatient Medications Prior to Visit  Medication Sig Dispense Refill   cetirizine (ZYRTEC) 10 MG tablet Take 10 mg by mouth daily as needed for allergies.     Cholecalciferol (VITAMIN D) 50 MCG (2000 UT) CAPS Take 1 capsule (2,000 Units total) by mouth daily. 30 capsule    Multiple Vitamins-Minerals (MULTIVITAMIN PO) Take 1 tablet by mouth daily.     No facility-administered medications prior to visit.     Per HPI unless specifically indicated in ROS section below Review of Systems  Objective:  BP 118/66    Pulse (!) 102    Temp 98 F (36.7 C) (Temporal)    Ht 5' (1.524 m)    Wt 214 lb 7 oz  (97.3 kg)    LMP 02/14/2015    SpO2 97%    BMI 41.88 kg/m   Wt Readings from Last 3 Encounters:  05/22/21 214 lb 7 oz (97.3 kg)  07/27/20 207 lb 9 oz (94.1 kg)  05/14/20 205 lb (93 kg)      Physical Exam Vitals and nursing note reviewed.  Constitutional:      Appearance: Normal appearance. She is not ill-appearing.  HENT:     Head: Normocephalic and atraumatic.     Right Ear: Tympanic membrane, ear canal and external ear normal. There is no impacted cerumen.     Left Ear: Tympanic membrane, ear canal and external ear normal. There is no impacted cerumen.     Ears:     Comments:  R TM with post-surgical changes Eyes:     Extraocular Movements: Extraocular movements intact.     Conjunctiva/sclera: Conjunctivae normal.     Pupils: Pupils are equal, round, and reactive to light.  Cardiovascular:     Rate and Rhythm: Normal rate and regular rhythm.     Pulses: Normal pulses.     Heart sounds: Normal heart sounds. No murmur heard. Pulmonary:     Effort: Pulmonary effort is normal. No respiratory distress.  Breath sounds: Normal breath sounds. No wheezing, rhonchi or rales.  Lymphadenopathy:     Head:     Right side of head: No submental, submandibular, tonsillar, preauricular or posterior auricular adenopathy.     Left side of head: No submental, submandibular, tonsillar, preauricular or posterior auricular adenopathy.     Cervical: No cervical adenopathy.     Right cervical: No superficial cervical adenopathy.    Left cervical: No superficial cervical adenopathy.     Upper Body:     Right upper body: No supraclavicular adenopathy.     Left upper body: No supraclavicular adenopathy.  Skin:    General: Skin is warm and dry.     Findings: No rash.  Neurological:     Mental Status: She is alert.  Psychiatric:        Mood and Affect: Mood normal.        Behavior: Behavior normal.      Lab Results  Component Value Date   HGBA1C 6.3 07/20/2020     Assessment & Plan:   This visit occurred during the SARS-CoV-2 public health emergency.  Safety protocols were in place, including screening questions prior to the visit, additional usage of staff PPE, and extensive cleaning of exam room while observing appropriate contact time as indicated for disinfecting solutions.   Problem List Items Addressed This Visit     Acute bronchitis    Anticipate acute bronchitis with persistent post-infectious cough. Don't see indication of bacterial infection at this time. Supportive care reviewed. Rx prednisone and cheratussin with codeine. Update if not improved with treatment.  Reviewed caution with sedation on codeine and caution with hyperglycemia on steroid. Viewed routine steroid precautions.         Meds ordered this encounter  Medications   predniSONE (DELTASONE) 20 MG tablet    Sig: Take two tablets daily for 3 days followed by one tablet daily for 3 days    Dispense:  10 tablet    Refill:  0   guaiFENesin-codeine (CHERATUSSIN AC) 100-10 MG/5ML syrup    Sig: Take 5 mLs by mouth 2 (two) times daily as needed for cough (sedation precautions).    Dispense:  100 mL    Refill:  0   No orders of the defined types were placed in this encounter.   Patient Instructions  Creo que su tos viene de inflamacion despues de infeccion viral.  Tome jarabe con codeina para la tos - majormente de noche.  Tome pastilla prednisona para inflamacion.  Debroah Baller agua, liquidos, descanso.  Dejenos saber si no mejora con esto.   Follow up plan: Return if symptoms worsen or fail to improve.  Ria Bush, MD

## 2021-05-22 NOTE — Assessment & Plan Note (Addendum)
Anticipate acute bronchitis with persistent post-infectious cough. Don't see indication of bacterial infection at this time. Supportive care reviewed. Rx prednisone and cheratussin with codeine. Update if not improved with treatment.  Reviewed caution with sedation on codeine and caution with hyperglycemia on steroid. Viewed routine steroid precautions.

## 2022-02-18 ENCOUNTER — Other Ambulatory Visit: Payer: Self-pay | Admitting: Family Medicine

## 2022-02-18 DIAGNOSIS — E559 Vitamin D deficiency, unspecified: Secondary | ICD-10-CM

## 2022-02-18 DIAGNOSIS — Z1159 Encounter for screening for other viral diseases: Secondary | ICD-10-CM

## 2022-02-18 DIAGNOSIS — R7303 Prediabetes: Secondary | ICD-10-CM

## 2022-02-21 ENCOUNTER — Other Ambulatory Visit (INDEPENDENT_AMBULATORY_CARE_PROVIDER_SITE_OTHER): Payer: Self-pay

## 2022-02-21 DIAGNOSIS — R7303 Prediabetes: Secondary | ICD-10-CM

## 2022-02-21 DIAGNOSIS — E559 Vitamin D deficiency, unspecified: Secondary | ICD-10-CM

## 2022-02-21 DIAGNOSIS — Z1159 Encounter for screening for other viral diseases: Secondary | ICD-10-CM

## 2022-02-21 LAB — COMPREHENSIVE METABOLIC PANEL
ALT: 19 U/L (ref 0–35)
AST: 15 U/L (ref 0–37)
Albumin: 3.9 g/dL (ref 3.5–5.2)
Alkaline Phosphatase: 84 U/L (ref 39–117)
BUN: 13 mg/dL (ref 6–23)
CO2: 29 mEq/L (ref 19–32)
Calcium: 9.8 mg/dL (ref 8.4–10.5)
Chloride: 101 mEq/L (ref 96–112)
Creatinine, Ser: 0.63 mg/dL (ref 0.40–1.20)
GFR: 98.44 mL/min (ref >60.00)
Glucose, Bld: 111 mg/dL — ABNORMAL HIGH (ref 70–99)
Potassium: 4.2 mEq/L (ref 3.5–5.1)
Sodium: 138 mEq/L (ref 135–145)
Total Bilirubin: 0.2 mg/dL (ref 0.2–1.2)
Total Protein: 7.7 g/dL (ref 6.0–8.3)

## 2022-02-21 LAB — LIPID PANEL
Cholesterol: 163 mg/dL (ref 0–200)
HDL: 82 mg/dL (ref >39.00)
LDL Cholesterol: 62 mg/dL (ref 0–99)
NonHDL: 81.23
Total CHOL/HDL Ratio: 2
Triglycerides: 94 mg/dL (ref 0.0–149.0)
VLDL: 18.8 mg/dL (ref 0.0–40.0)

## 2022-02-21 LAB — HEMOGLOBIN A1C: Hgb A1c MFr Bld: 6.8 % — ABNORMAL HIGH (ref 4.6–6.5)

## 2022-02-21 LAB — VITAMIN D 25 HYDROXY (VIT D DEFICIENCY, FRACTURES): VITD: 37.92 ng/mL (ref 30.00–100.00)

## 2022-02-24 LAB — HEPATITIS C ANTIBODY: Hepatitis C Ab: NONREACTIVE

## 2022-02-28 ENCOUNTER — Ambulatory Visit (INDEPENDENT_AMBULATORY_CARE_PROVIDER_SITE_OTHER): Payer: Self-pay | Admitting: Family Medicine

## 2022-02-28 ENCOUNTER — Encounter: Payer: Self-pay | Admitting: Family Medicine

## 2022-02-28 VITALS — BP 124/68 | HR 87 | Temp 97.9°F | Ht 60.25 in | Wt 215.1 lb

## 2022-02-28 DIAGNOSIS — E1169 Type 2 diabetes mellitus with other specified complication: Secondary | ICD-10-CM

## 2022-02-28 DIAGNOSIS — H401133 Primary open-angle glaucoma, bilateral, severe stage: Secondary | ICD-10-CM | POA: Insufficient documentation

## 2022-02-28 DIAGNOSIS — Z1211 Encounter for screening for malignant neoplasm of colon: Secondary | ICD-10-CM

## 2022-02-28 DIAGNOSIS — H409 Unspecified glaucoma: Secondary | ICD-10-CM

## 2022-02-28 DIAGNOSIS — E559 Vitamin D deficiency, unspecified: Secondary | ICD-10-CM

## 2022-02-28 DIAGNOSIS — Z0001 Encounter for general adult medical examination with abnormal findings: Secondary | ICD-10-CM

## 2022-02-28 NOTE — Patient Instructions (Addendum)
We will request latest mammogram and pap smear from Dr Julien Girt for [facility].  You may call [facility] to schedule an appointment for colonoscopy at [phone]. Azucar (A1c) esta en rangos de diabetes. Trabaje en dieta baja en carbohidratos, azucar.  Gusto verla hoy.  Regresar en 3-4 meses para revision de diabetes.   [place] [facility] for Postmenopausal Women La menopausia es un proceso normal en el cual la capacidad de quedar embarazada llega a su fin. Este proceso ocurre lentamente a lo largo de un perodo de muchos meses o aos; por lo general, entre los 71 y los 47 aos. La menopausia es completa cuando no se ha tenido el perodo menstrual por 12 meses. Es importante hablar con el mdico sobre algunas de las enfermedades ms comunes que afectan a las mujeres despus de la menopausia (mujeres posmenopusicas). Estas incluyen la enfermedad cardaca, el cncer y la prdida sea (osteoporosis). Adoptar un estilo de vida saludable y recibir atencin preventiva pueden ayudar a promover la salud y Musician. Las medidas que tome tambin pueden reducir las probabilidades de Actor algunas de estas enfermedades frecuentes. Cules son los signos y sntomas de la menopausia? Durante la menopausia, puede tener los siguientes sntomas: Nurse, learning disability. Estos pueden ser moderados o intensos. Sudoracin nocturna. Disminucin del deseo sexual. Cambios en el estado de nimo. Dolores de [place]. Cansancio (fatiga). Irritabilidad. Problemas de memoria. Problemas para quedarse dormida o para seguir durmiendo. Hable con el mdico sobre las opciones de tratamiento para sus sntomas. Necesito terapia de reemplazo hormonal? La terapia de reemplazo hormonal es eficaz para tratar los sntomas causados por la menopausia, como los acaloramientos y las sudoraciones nocturnas. La reposicin hormonal conlleva ciertos riesgos, especialmente  a medida que una mujer envejece. Si est pensando en usar estrgeno o estrgeno con progestina, analice los beneficios y los riesgos con el mdico. Cmo puedo reducir el riesgo de tener enfermedad cardaca y accidente cerebrovascular? A medida que se envejece, aumenta el riesgo de enfermedad cardaca, infarto de miocardio y accidente cerebrovascular. Una de las causas puede ser un cambio en las hormonas del cuerpo durante la menopausia. Esto puede afectar la forma en que el organismo procesa las [place], los triglicridos y el colesterol de su dieta. El infarto de miocardio y el accidente cerebrovascular son emergencias mdicas. Hay muchas cosas que se pueden hacer para ayudar a prevenir la enfermedad cardaca y el accidente cerebrovascular. Contrlese la presin arterial La hipertensin arterial causa enfermedades cardacas y [place] el riesgo de accidente cerebrovascular. Es ms probable que esto se manifieste en las personas que tienen lecturas de presin arterial alta o tienen sobrepeso. Hgase controlar la presin arterial: Cada 3 a 5 aos si tiene entre 18 y 51 aos. Todos los aos si es mayor de 40 aos. Consuma una dieta saludable  Consuma una dieta que incluya muchas verduras, frutas, productos lcteos con bajo contenido de [place] y Advertising account planner. No consuma muchos alimentos ricos en grasas slidas, azcares agregados o sodio. Haga ejercicio con regularidad Haga ejercicio con regularidad. Esta es una de las prcticas ms importantes que puede hacer por su salud. La mayora de los adultos deben seguir estas pautas: Intente realizar al menos 150 minutos de actividad fsica por semana. El ejercicio debe aumentar la frecuencia cardaca y Nature conservation officer transpirar (ejercicio de intensidad moderada). Intente hacer ejercicios de elongacin por lo menos dos veces por semana. Agrguelos al plan de ejercicio de intensidad moderada. Pase menos tiempo sentada. Incluso la  actividad fsica ligera puede ser  beneficiosa. Otros consejos Trabaje con su mdico para Science writer o Theatre manager un peso saludable. No consuma ningn producto que contenga nicotina o tabaco. Estos productos incluyen cigarrillos, tabaco para Higher education careers adviser y aparatos de vapeo, como los Psychologist, sport and exercise. Si necesita ayuda para dejar de consumir estos productos, consulte al mdico. Conozca sus cifras. Pdale al mdico que le controle el colesterol y el nivel sanguneo de azcar en la sangre (glucosa). Siga hacindose anlisis de [employer] se lo haya indicado el mdico. Necesito realizarme pruebas de deteccin del cncer? Segn sus antecedentes mdicos y familiares, es posible que deba realizarse pruebas de deteccin del cncer en diferentes etapas de la vida. Esto puede incluir pruebas de deteccin de lo siguiente: Cncer de mama. Cncer de cuello uterino. Cncer de pulmn. Cncer colorrectal. Cul es mi riesgo de tener osteoporosis? Despus de la menopausia, puede correr un riesgo ms alto de tener osteoporosis. La osteoporosis es una afeccin en la cual la destruccin de la masa sea ocurre con mayor rapidez que su formacin. Para ayudar a prevenir esta afeccin o las fracturas seas que pueden ocurrir a causa de [place], usted puede tomar las siguientes medidas: Si tiene entre 73 y 59 aos, reciba como mnimo 1000 mg de calcio y 600 unidades internacionales (UI) de vitamina D por Training and development officer. Si tiene ms de 50 aos pero menos de 70 aos, reciba como mnimo 1200 mg de calcio y al menos 600 unidades internacionales (UI) de vitamina D por da. Si tiene ms de 70 aos, reciba como mnimo 1200 mg de calcio y al menos 800 unidades internacionales (UI) de vitamina D por da. Fumar y beber alcohol en exceso aumentan el riesgo de osteoporosis. Consuma alimentos ricos en calcio y vitamina D, y haga ejercicios con soporte de peso varias veces a la [place], como se lo haya indicado el mdico. De qu manera la menopausia afecta mi salud mental? La  depresin puede presentarse a cualquier edad, pero es ms frecuente a medida que una persona envejece. Los sntomas comunes de depresin incluyen lo siguiente: Sensacin de depresin. Cambios en los patrones de sueo. Cambios en el apetito o en los hbitos de alimentacin. Sensacin de falta general de motivacin o placer al [employer] actividades que sola disfrutar. Crisis frecuentes de llanto. Hable con el mdico si cree que est experimentando alguno de estos sntomas. Indicaciones generales Visite a su mdico para hacerse exmenes de bienestar peridicos y aplicarse vacunas. Puede incluir: Programar exmenes peridicos dentales, de la salud y de Public librarian. Recibir y [employer]. Estos incluyen los siguientes: Human resources officer. Aplquese esta vacuna todos los aos antes de que comience la temporada de gripe. Vacuna contra la neumona. Vacuna contra el herpes. Vacuna contra el ttanos, la difteria y la tos Dyann Ruddle (Tdap). El mdico tambin puede recomendarle que se aplique otras vacunas. Notifique a su mdico si alguna vez ha sido vctima de abuso o si no se siente seguro en su hogar. Resumen La menopausia es un proceso normal en el cual la capacidad de quedar embarazada llega a su fin. Esta condicin causa acaloramientos, sudoraciones nocturnas, disminucin del inters en el sexo, cambios en el estado de nimo, dolores de [place] o falta de sueo. El tratamiento de esta afeccin puede incluir una terapia de reemplazo hormonal. Tome medidas para mantenerse 65, entre ellas, hacer ejercicio con regularidad, seguir una dieta saludable, controlar su peso y medirse la presin arterial y los niveles de Dispensing optician. Hgase pruebas  para detectar cncer y depresin. Asegrese de estar al da con todas las vacunas. Esta informacin no tiene Marine scientist el consejo del mdico. Asegrese de hacerle al mdico cualquier pregunta que tenga. Document Revised: 10/02/2020  Document Reviewed: 10/02/2020 Elsevier Patient Education  [place].

## 2022-02-28 NOTE — Assessment & Plan Note (Signed)
Preventative protocols reviewed and updated unless pt declined. Discussed healthy diet and lifestyle.  

## 2022-02-28 NOTE — Assessment & Plan Note (Signed)
Regularly sees Dr Katy Fitch for this, on regular eye drops.

## 2022-02-28 NOTE — Assessment & Plan Note (Addendum)
Reviewed trend in A1c into diabetes range - recommend metformin, to consider weekly GLP1ra, she declines as prefers to work on diet/lifestyle changes over next 3 months which is reasonable.  Recommend low sugar low carb diabetic diet, renewed efforts to incorporate aerobic exercise into routine.  RTC 3 mo DM f/u visit.  She has previously gone to nutritionist (2018).

## 2022-02-28 NOTE — Assessment & Plan Note (Signed)
Encouraged healthy diet and lifestyle choices to affect sustainable weight loss.  ?

## 2022-02-28 NOTE — Progress Notes (Addendum)
Patient ID: Julie Vazquez, female    DOB: [birth year], 56 y.o.   MRN: [id number]  This visit was conducted in person.  BP 124/68   Pulse 87   Temp 97.9 F (36.6 C) (Temporal)   Ht 5' 0.25" (1.53 m)   Wt 215 lb 2 oz (97.6 kg)   LMP 02/14/2015   SpO2 97%   BMI 41.67 kg/m    CC: CPE Subjective:   HPI: Julie Vazquez is a 56 y.o. female presenting on 02/28/2022 for Annual Exam (Wants to discuss vit D levels. )   Currently without insurance until the new year.   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   Saw nutritionist 5 yrs ago.    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  Well woman exam yearly with OBGYN (Dr Orvan Seen), normal pap smear 2023. H/o fibroids, sees GYN for this.  Mammogram - dense tissue rec yearly with GYN. records requested.  LMP - years ago Lung cancer screening - not eligible  Flu shot - declines  COVID vaccine J&J 07/2019  Td 2007, due, declines Shingrix - discussed, defers for now  Seat belt use discussed.  Sunscreen use discussed. No changing moles on skin.  Non smoker Alcohol - seldom  Dentist q6 monthly  Eye exam q6 mo for glaucoma ([place])   [place] Caffeine: 1-2 cups coffee/day Lives with husband and 2 children Occupation: Copywriter, advertising for endodontist  Activity: 3-5x/wk cardio and weights (Physiological scientist) - restarted regular exercise routine   Diet: fruits/vegetables daily, good water, red meat occasional, 2x/wk fish, avoids sugar and carbs      Relevant past medical, surgical, family and social history reviewed and updated as indicated. Interim medical history since our last visit reviewed. Allergies and medications reviewed and updated. Outpatient Medications Prior to Visit  Medication Sig Dispense Refill   brimonidine (ALPHAGAN) 0.15 % ophthalmic solution Place 1 drop  into both eyes in the morning and at bedtime.     cetirizine (ZYRTEC) 10 MG tablet Take 10 mg by mouth daily as needed for allergies.     Cholecalciferol (VITAMIN D) 50 MCG (2000 UT) CAPS Take 1 capsule (2,000 Units total) by mouth daily. 30 capsule    latanoprost (XALATAN) 0.005 % ophthalmic solution Place 1 drop into both eyes at bedtime.     Multiple Vitamins-Minerals (MULTIVITAMIN PO) Take 1 tablet by mouth daily.     timolol (BETIMOL) 0.5 % ophthalmic solution Place 1 drop into both eyes 2 (two) times daily.     guaiFENesin-codeine (CHERATUSSIN AC) 100-10 MG/5ML syrup Take 5 mLs by mouth 2 (two) times daily as needed for cough (sedation precautions). 100 mL 0   predniSONE (DELTASONE) 20 MG tablet Take two tablets daily for 3 days followed by one tablet daily for 3 days 10 tablet 0   No facility-administered medications prior to visit.     Per HPI unless specifically indicated in ROS section below Review of Systems  Constitutional:  Negative for activity change, appetite change, chills, fatigue, fever and unexpected weight change.  HENT:  Negative for hearing loss.   Eyes:  Negative for visual disturbance.  Respiratory:  Negative for cough, chest tightness, shortness of breath and wheezing.   Cardiovascular:  Positive for leg swelling (occ). Negative for chest pain and palpitations.  Gastrointestinal:  Negative for abdominal distention, abdominal pain, blood in stool, constipation, diarrhea, nausea and  vomiting.  Genitourinary:  Negative for difficulty urinating and hematuria.  Musculoskeletal:  Negative for arthralgias, myalgias and neck pain.  Skin:  Negative for rash.  Neurological:  Negative for dizziness, seizures, syncope and headaches.  Hematological:  Negative for adenopathy. Does not bruise/bleed easily.  Psychiatric/Behavioral:  Negative for dysphoric mood. The patient is not nervous/anxious.     Objective:  BP 124/68   Pulse 87   Temp 97.9 F (36.6 C) (Temporal)   Ht  5' 0.25" (1.53 m)   Wt 215 lb 2 oz (97.6 kg)   LMP 02/14/2015   SpO2 97%   BMI 41.67 kg/m   Wt Readings from Last 3 Encounters:  02/28/22 215 lb 2 oz (97.6 kg)  05/22/21 214 lb 7 oz (97.3 kg)  07/27/20 207 lb 9 oz (94.1 kg)      Physical Exam Vitals and nursing note reviewed.  Constitutional:      Appearance: Normal appearance. She is not ill-appearing.  HENT:     Head: Normocephalic and atraumatic.     Right Ear: Tympanic membrane, ear canal and external ear normal. There is no impacted cerumen.     Left Ear: Tympanic membrane, ear canal and external ear normal. There is no impacted cerumen.     Mouth/Throat:     Comments: Wearing mask Eyes:     General:        Right eye: No discharge.        Left eye: No discharge.     Extraocular Movements: Extraocular movements intact.     Conjunctiva/sclera: Conjunctivae normal.     Pupils: Pupils are equal, round, and reactive to light.  Neck:     Thyroid: No thyroid mass or thyromegaly.     Vascular: No carotid bruit.  Cardiovascular:     Rate and Rhythm: Normal rate and regular rhythm.     Pulses: Normal pulses.     Heart sounds: Normal heart sounds. No murmur heard. Pulmonary:     Effort: Pulmonary effort is normal. No respiratory distress.     Breath sounds: Normal breath sounds. No wheezing, rhonchi or rales.  Abdominal:     General: Bowel sounds are normal. There is no distension.     Palpations: Abdomen is soft. There is no mass.     Tenderness: There is no abdominal tenderness. There is no guarding or rebound.     Hernia: No hernia is present.  Musculoskeletal:     Cervical back: Normal range of motion and neck supple. No rigidity.     Right lower leg: No edema.     Left lower leg: No edema.  Lymphadenopathy:     Cervical: No cervical adenopathy.  Skin:    General: Skin is warm and dry.     Findings: No rash.  Neurological:     General: No focal deficit present.     Mental Status: She is alert. Mental status is at  baseline.  Psychiatric:        Mood and Affect: Mood normal.        Behavior: Behavior normal.       Results for orders placed or performed in visit on 02/21/22  Hepatitis C antibody  Result Value Ref Range   Hepatitis C Ab NON-REACTIVE NON-REACTIVE  Hemoglobin A1c  Result Value Ref Range   Hgb A1c MFr Bld 6.8 (H) 4.6 - 6.5 %  VITAMIN D 25 Hydroxy (Vit-D Deficiency, Fractures)  Result Value Ref Range   VITD 37.92 30.00 - 100.00  ng/mL  Lipid panel  Result Value Ref Range   Cholesterol 163 0 - 200 mg/dL   Triglycerides 94.0 0.0 - 149.0 mg/dL   HDL 82.00 >39.00 mg/dL   VLDL 18.8 0.0 - 40.0 mg/dL   LDL Cholesterol 62 0 - 99 mg/dL   Total CHOL/HDL Ratio 2    NonHDL 81.23   Comprehensive metabolic panel  Result Value Ref Range   Sodium 138 135 - 145 mEq/L   Potassium 4.2 3.5 - 5.1 mEq/L   Chloride 101 96 - 112 mEq/L   CO2 29 19 - 32 mEq/L   Glucose, Bld 111 (H) 70 - 99 mg/dL   BUN 13 6 - 23 mg/dL   Creatinine, Ser 0.63 0.40 - 1.20 mg/dL   Total Bilirubin 0.2 0.2 - 1.2 mg/dL   Alkaline Phosphatase 84 39 - 117 U/L   AST 15 0 - 37 U/L   ALT 19 0 - 35 U/L   Total Protein 7.7 6.0 - 8.3 g/dL   Albumin 3.9 3.5 - 5.2 g/dL   GFR 98.44 >60.00 mL/min   Calcium 9.8 8.4 - 10.5 mg/dL    Assessment & Plan:   Problem List Items Addressed This Visit     Encounter for general adult medical examination with abnormal findings - Primary (Chronic)    Preventative protocols reviewed and updated unless pt declined. Discussed healthy diet and lifestyle.       Vitamin D deficiency    Stable period on 2000 IU daily.       Obesity, morbid, BMI 40.0-49.9 ([place])    Encouraged healthy diet and lifestyle choices to affect sustainable weight loss.       Type 2 diabetes mellitus with other specified complication ([place])    Reviewed trend in A1c into diabetes range - recommend metformin, to consider weekly GLP1ra, she declines as prefers to work on diet/lifestyle changes over next 3 months which  is reasonable.  Recommend low sugar low carb diabetic diet, renewed efforts to incorporate aerobic exercise into routine.  RTC 3 mo DM f/u visit.  She has previously gone to nutritionist (2018).       Glaucoma    Regularly sees Dr Katy Fitch for this, on regular eye drops.       Relevant Medications   latanoprost (XALATAN) 0.005 % ophthalmic solution   brimonidine (ALPHAGAN) 0.15 % ophthalmic solution   timolol (BETIMOL) 0.5 % ophthalmic solution   Other Visit Diagnoses     Special screening for malignant neoplasms, colon       Relevant Orders   Ambulatory referral to Gastroenterology        No orders of the defined types were placed in this encounter.  Orders Placed This Encounter  Procedures   Ambulatory referral to Gastroenterology    Referral Priority:   Routine    Referral Type:   Consultation    Referral Reason:   Specialty Services Required    Number of Visits Requested:   1    Patient instructions: We will request latest mammogram and pap smear from Dr Julien Girt for [facility].  You may call [facility] to schedule an appointment for colonoscopy at [phone]. Azucar (A1c) esta en rangos de diabetes. Trabaje en dieta baja en carbohidratos, azucar.  Gusto verla hoy.  Regresar en 3-4 meses para revision de diabetes.   Follow up plan: Return in about 3 months (around 05/31/2022) for follow up visit.  Ria Bush, MD

## 2022-02-28 NOTE — Assessment & Plan Note (Signed)
Stable period on 2000 IU daily.

## 2022-03-05 ENCOUNTER — Encounter: Payer: Self-pay | Admitting: Family Medicine

## 2022-03-13 LAB — HM DIABETES EYE EXAM

## 2022-03-14 ENCOUNTER — Encounter: Payer: Self-pay | Admitting: Family Medicine

## 2022-05-30 ENCOUNTER — Ambulatory Visit: Payer: 59 | Admitting: Family Medicine

## 2022-05-30 ENCOUNTER — Encounter: Payer: Self-pay | Admitting: Family Medicine

## 2022-05-30 VITALS — BP 136/68 | HR 82 | Temp 97.7°F | Ht 60.25 in | Wt 211.0 lb

## 2022-05-30 DIAGNOSIS — E1169 Type 2 diabetes mellitus with other specified complication: Secondary | ICD-10-CM | POA: Diagnosis not present

## 2022-05-30 DIAGNOSIS — E559 Vitamin D deficiency, unspecified: Secondary | ICD-10-CM | POA: Diagnosis not present

## 2022-05-30 LAB — POCT GLYCOSYLATED HEMOGLOBIN (HGB A1C): Hemoglobin A1C: 6 % — AB (ref 4.0–5.6)

## 2022-05-30 NOTE — Progress Notes (Signed)
Patient ID: Julie Vazquez, female    DOB: 21-Mar-1966, 57 y.o.   MRN: [id number]  This visit was conducted in person.  BP 136/68   Pulse 82   Temp 97.7 F (36.5 C) (Temporal)   Ht 5' 0.25" (1.53 m)   Wt 211 lb (95.7 kg)   LMP 02/14/2015   SpO2 97%   BMI 40.87 kg/m    CC: DM f/u visit  Subjective:   HPI: Julie Vazquez is a 57 y.o. female presenting on 05/30/2022 for Medical Management of Chronic Issues (Here for 3 mo DM f/u. Wants to discuss vit D. )   DM - does not regularly check sugars. Compliant with antihyperglycemic regimen which includes: diet controlled - planned 3 month f/u after new DM diagnosis last visit. She's cut out sugar and breads, sweetened beverages - feels much better. Denies low sugars or hypoglycemic symptoms. Denies paresthesias, blurry vision. Last diabetic eye exam 03/2022. Glucometer brand: doesn't have one. Last foot exam: DUE. DSME: interested. Lab Results  Component Value Date   HGBA1C 6.8 (H) 02/21/2022   Diabetic Foot Exam - Simple   Simple Foot Form Diabetic Foot exam was performed with the following findings: Yes 05/30/2022  3:33 PM  Visual Inspection See comments: Yes Sensation Testing Intact to touch and monofilament testing bilaterally: Yes Pulse Check Posterior Tibialis and Dorsalis pulse intact bilaterally: Yes Comments Maceration to R 4th/5th interdigital area    No results found for: "MICROALBUR", "MALB24HUR"   No regular exercise routine yet - planning to start using treadmill. She also has boxing equipment at home.     Relevant past medical, surgical, family and social history reviewed and updated as indicated. Interim medical history since our last visit reviewed. Allergies and medications reviewed and updated. Outpatient Medications Prior to Visit  Medication Sig Dispense Refill   brimonidine (ALPHAGAN) 0.15 % ophthalmic solution Place 1 drop into both eyes in the morning and at bedtime.     cetirizine (ZYRTEC) 10 MG  tablet Take 10 mg by mouth daily as needed for allergies.     Cholecalciferol (VITAMIN D) 50 MCG (2000 UT) CAPS Take 1 capsule (2,000 Units total) by mouth daily. 30 capsule    latanoprost (XALATAN) 0.005 % ophthalmic solution Place 1 drop into both eyes at bedtime.     Multiple Vitamins-Minerals (MULTIVITAMIN PO) Take 1 tablet by mouth daily.     timolol (BETIMOL) 0.5 % ophthalmic solution Place 1 drop into both eyes 2 (two) times daily.     No facility-administered medications prior to visit.     Per HPI unless specifically indicated in ROS section below Review of Systems  Objective:  BP 136/68   Pulse 82   Temp 97.7 F (36.5 C) (Temporal)   Ht 5' 0.25" (1.53 m)   Wt 211 lb (95.7 kg)   LMP 02/14/2015   SpO2 97%   BMI 40.87 kg/m   Wt Readings from Last 3 Encounters:  05/30/22 211 lb (95.7 kg)  02/28/22 215 lb 2 oz (97.6 kg)  05/22/21 214 lb 7 oz (97.3 kg)      Physical Exam Vitals and nursing note reviewed.  Constitutional:      Appearance: Normal appearance. She is not ill-appearing.  Eyes:     Extraocular Movements: Extraocular movements intact.     Conjunctiva/sclera: Conjunctivae normal.     Pupils: Pupils are equal, round, and reactive to light.  Cardiovascular:     Rate and Rhythm: Normal rate and regular rhythm.  Pulses: Normal pulses.     Heart sounds: Normal heart sounds. No murmur heard. Pulmonary:     Effort: Pulmonary effort is normal. No respiratory distress.     Breath sounds: Normal breath sounds. No wheezing, rhonchi or rales.  Musculoskeletal:     Right lower leg: No edema.     Left lower leg: No edema.     Comments: See HPI for foot exam if done  Skin:    General: Skin is warm and dry.     Findings: No rash.  Neurological:     Mental Status: She is alert.  Psychiatric:        Mood and Affect: Mood normal.        Behavior: Behavior normal.       Results for orders placed or performed in visit on 03/14/22  HM DIABETES EYE EXAM  Result  Value Ref Range   HM Diabetic Eye Exam No Retinopathy No Retinopathy   Last vitamin D Lab Results  Component Value Date   VD25OH 37.92 02/21/2022   Assessment & Plan:   Problem List Items Addressed This Visit     Vitamin D deficiency    Stable period on 2000 IU daily - continue      Type 2 diabetes mellitus with other specified complication ([place]) - Primary    Great improvement with A1c down to 6% now in prediabetes range with healthy diet changes - congratulated!  She is motivated to continue healthy diet efforts, as well as planning to incorporate regular exercise into routine.  Foot exam today.  RTC 4-5 mo DM f/u visit.       Relevant Orders   POCT glycosylated hemoglobin (Hb A1C)     No orders of the defined types were placed in this encounter.   Orders Placed This Encounter  Procedures   POCT glycosylated hemoglobin (Hb A1C)    Patient Instructions  Ask for diabetic eye exam at next eye doctor appointment.  Felicitaciones con cambios saludables [place] - a seguir!  Use clotrimazole (lotrimin) para entre dedos de los pies especialmente lado derecho entre 4o y 5o dedos.  Gusto verla hoy.  Regresar en 4-5 meses para proxima visita de diabetes.  La remitiremos a nutricionista   Follow up plan: Return in about 5 months (around 10/29/2022), or if symptoms worsen or fail to improve, for follow up visit.  Ria Bush, MD

## 2022-05-30 NOTE — Assessment & Plan Note (Signed)
Stable period on 2000 IU daily - continue

## 2022-05-30 NOTE — Patient Instructions (Addendum)
Ask for diabetic eye exam at next eye doctor appointment.  Felicitaciones con cambios saludables [place] - a seguir!  Use clotrimazole (lotrimin) para entre dedos de los pies especialmente lado derecho entre 4o y 5o dedos.  Gusto verla hoy.  Regresar en 4-5 meses para proxima visita de diabetes.  La remitiremos a nutricionista

## 2022-05-30 NOTE — Assessment & Plan Note (Signed)
Great improvement with A1c down to 6% now in prediabetes range with healthy diet changes - congratulated!  She is motivated to continue healthy diet efforts, as well as planning to incorporate regular exercise into routine.  Foot exam today.  RTC 4-5 mo DM f/u visit.

## 2022-06-25 DIAGNOSIS — Z01419 Encounter for gynecological examination (general) (routine) without abnormal findings: Secondary | ICD-10-CM | POA: Diagnosis not present

## 2022-06-25 DIAGNOSIS — Z6839 Body mass index (BMI) 39.0-39.9, adult: Secondary | ICD-10-CM | POA: Diagnosis not present

## 2022-06-27 LAB — HM PAP SMEAR: HPV, high-risk: NEGATIVE

## 2022-07-04 DIAGNOSIS — Z1231 Encounter for screening mammogram for malignant neoplasm of breast: Secondary | ICD-10-CM | POA: Diagnosis not present

## 2022-07-04 LAB — HM MAMMOGRAPHY

## 2022-10-09 ENCOUNTER — Ambulatory Visit (INDEPENDENT_AMBULATORY_CARE_PROVIDER_SITE_OTHER): Payer: Self-pay | Admitting: Family Medicine

## 2022-10-09 ENCOUNTER — Encounter: Payer: Self-pay | Admitting: Family Medicine

## 2022-10-09 ENCOUNTER — Ambulatory Visit: Payer: 59 | Admitting: Family Medicine

## 2022-10-09 VITALS — BP 136/82 | HR 76 | Temp 97.6°F | Ht 60.25 in | Wt 208.1 lb

## 2022-10-09 DIAGNOSIS — E559 Vitamin D deficiency, unspecified: Secondary | ICD-10-CM

## 2022-10-09 DIAGNOSIS — E1169 Type 2 diabetes mellitus with other specified complication: Secondary | ICD-10-CM

## 2022-10-09 DIAGNOSIS — H401133 Primary open-angle glaucoma, bilateral, severe stage: Secondary | ICD-10-CM

## 2022-10-09 LAB — POCT GLYCOSYLATED HEMOGLOBIN (HGB A1C): Hemoglobin A1C: 5.9 % — AB (ref 4.0–5.6)

## 2022-10-09 MED ORDER — VITAMIN D3 1.25 MG (50000 UT) PO TABS: 1.0000 | ORAL_TABLET | ORAL | 1 refills | Status: DC

## 2022-10-09 NOTE — Assessment & Plan Note (Signed)
Forgets daily 2000 IU dose, desires to restart weekly dose - I've sent this to pharmacy

## 2022-10-09 NOTE — Progress Notes (Signed)
Ph: [phone] Fax: [phone]   Patient ID: Julie Vazquez, female    DOB: January 17, 1966, 57 y.o.   MRN: [id number]  This visit was conducted in person.  BP 136/82   Pulse 76   Temp 97.6 F (36.4 C) (Temporal)   Ht 5' 0.25" (1.53 m)   Wt 208 lb 2 oz (94.4 kg)   LMP 02/14/2015   SpO2 95%   BMI 40.31 kg/m    CC: DM f/u visit  Subjective:   HPI: Julie Vazquez is a 57 y.o. female presenting on 10/09/2022 for Medical Management of Chronic Issues (Here for 4 mo DM f/u. )   Sister recently passed away - had liver disease.   She has restarted walking - tries every afternoon.  She's had trouble taking daily vit D - requests weekly Rx dose.   DM - does not regularly check sugars. Compliant with antihyperglycemic regimen which includes: diet control. Denies low sugars or hypoglycemic symptoms. Denies blurry vision. Infrequent paresthesias. Last diabetic eye exam 03/2022. Glucometer brand: doesn't have one. Last foot exam: 05/2022. DSME: discussed - will defer for now. Lab Results  Component Value Date   HGBA1C 5.9 (A) 10/09/2022   Diabetic Foot Exam - Simple   No data filed    No results found for: "MICROALBUR", "MALB24HUR"       Relevant past medical, surgical, family and social history reviewed and updated as indicated. Interim medical history since our last visit reviewed. Allergies and medications reviewed and updated. Outpatient Medications Prior to Visit  Medication Sig Dispense Refill   brimonidine (ALPHAGAN) 0.15 % ophthalmic solution Place 1 drop into both eyes in the morning and at bedtime.     cetirizine (ZYRTEC) 10 MG tablet Take 10 mg by mouth daily as needed for allergies.     latanoprost (XALATAN) 0.005 % ophthalmic solution Place 1 drop into both eyes at bedtime.     Multiple Vitamins-Minerals (MULTIVITAMIN PO) Take 1 tablet by mouth daily.     timolol (BETIMOL) 0.5 % ophthalmic solution Place 1 drop into both eyes 2 (two) times daily.     Cholecalciferol  (VITAMIN D) 50 MCG (2000 UT) CAPS Take 1 capsule (2,000 Units total) by mouth daily. 30 capsule    No facility-administered medications prior to visit.     Per HPI unless specifically indicated in ROS section below Review of Systems  Objective:  BP 136/82   Pulse 76   Temp 97.6 F (36.4 C) (Temporal)   Ht 5' 0.25" (1.53 m)   Wt 208 lb 2 oz (94.4 kg)   LMP 02/14/2015   SpO2 95%   BMI 40.31 kg/m   Wt Readings from Last 3 Encounters:  10/09/22 208 lb 2 oz (94.4 kg)  05/30/22 211 lb (95.7 kg)  02/28/22 215 lb 2 oz (97.6 kg)      Physical Exam Vitals and nursing note reviewed.  Constitutional:      Appearance: Normal appearance. She is not ill-appearing.  Eyes:     Extraocular Movements: Extraocular movements intact.     Conjunctiva/sclera: Conjunctivae normal.     Pupils: Pupils are equal, round, and reactive to light.  Cardiovascular:     Rate and Rhythm: Normal rate and regular rhythm.     Pulses: Normal pulses.     Heart sounds: Normal heart sounds. No murmur heard. Pulmonary:     Effort: Pulmonary effort is normal. No respiratory distress.     Breath sounds: Normal breath sounds. No wheezing, rhonchi or  rales.  Musculoskeletal:     Right lower leg: No edema.     Left lower leg: No edema.  Skin:    General: Skin is warm and dry.     Findings: No rash.  Neurological:     Mental Status: She is alert.  Psychiatric:        Mood and Affect: Mood normal.        Behavior: Behavior normal.       Results for orders placed or performed in visit on 10/09/22  POCT glycosylated hemoglobin (Hb A1C)  Result Value Ref Range   Hemoglobin A1C 5.9 (A) 4.0 - 5.6 %   HbA1c POC (<> result, manual entry)     HbA1c, POC (prediabetic range)     HbA1c, POC (controlled diabetic range)     Lab Results  Component Value Date   VD25OH 37.92 02/21/2022    Assessment & Plan:   Problem List Items Addressed This Visit     Vitamin D deficiency    Forgets daily 2000 IU dose, desires  to restart weekly dose - I've sent this to pharmacy      Obesity, morbid, BMI 40.0-49.9 (HCC)    Congratulated on ongoing weight loss to date - she is motivated to continue regular walking routine.  Had lost more weight but noted weight gain in setting of grieving sister's loss.       Type 2 diabetes mellitus with other specified complication (HCC) - Primary    Chronic, great control with A1c remaining in prediabetes range.  If persistent good control next visit, will change diagnosis.  Pt plans to continue regular walking routine.       Relevant Orders   POCT glycosylated hemoglobin (Hb A1C) (Completed)   Primary open angle glaucoma (POAG) of both eyes, severe stage     Meds ordered this encounter  Medications   Cholecalciferol (VITAMIN D3) 1.25 MG (50000 UT) TABS    Sig: Take 1 tablet by mouth once a week.    Dispense:  12 tablet    Refill:  1    Orders Placed This Encounter  Procedures   POCT glycosylated hemoglobin (Hb A1C)    Patient Instructions  Azucar esta bien controlada en rangos de prediabetes.  Sigan caminando regularmente.  Regresar en 5 meses para examen fisico  Follow up plan: Return in about 5 months (around 03/11/2023) for annual exam, prior fasting for blood work.  Julie Boyden, MD

## 2022-10-09 NOTE — Assessment & Plan Note (Signed)
Congratulated on ongoing weight loss to date - she is motivated to continue regular walking routine.  Had lost more weight but noted weight gain in setting of grieving sister's loss.

## 2022-10-09 NOTE — Assessment & Plan Note (Addendum)
Chronic, great control with A1c remaining in prediabetes range.  If persistent good control next visit, will change diagnosis.  Pt plans to continue regular walking routine.

## 2022-10-09 NOTE — Patient Instructions (Addendum)
Azucar esta bien controlada en rangos de prediabetes.  Sigan caminando regularmente.  Regresar en 5 meses para examen fisico

## 2022-10-10 ENCOUNTER — Ambulatory Visit: Payer: 59 | Admitting: Family Medicine

## 2022-10-21 DIAGNOSIS — R7309 Other abnormal glucose: Secondary | ICD-10-CM | POA: Diagnosis not present

## 2022-10-21 DIAGNOSIS — H11132 Conjunctival pigmentations, left eye: Secondary | ICD-10-CM | POA: Diagnosis not present

## 2022-10-21 DIAGNOSIS — H401133 Primary open-angle glaucoma, bilateral, severe stage: Secondary | ICD-10-CM | POA: Diagnosis not present

## 2022-10-21 LAB — HM DIABETES EYE EXAM

## 2023-02-26 ENCOUNTER — Other Ambulatory Visit: Payer: Self-pay | Admitting: Family Medicine

## 2023-02-26 DIAGNOSIS — E1169 Type 2 diabetes mellitus with other specified complication: Secondary | ICD-10-CM

## 2023-02-26 DIAGNOSIS — E559 Vitamin D deficiency, unspecified: Secondary | ICD-10-CM

## 2023-03-04 ENCOUNTER — Other Ambulatory Visit (INDEPENDENT_AMBULATORY_CARE_PROVIDER_SITE_OTHER): Payer: 59

## 2023-03-04 DIAGNOSIS — E559 Vitamin D deficiency, unspecified: Secondary | ICD-10-CM

## 2023-03-04 DIAGNOSIS — E1169 Type 2 diabetes mellitus with other specified complication: Secondary | ICD-10-CM | POA: Diagnosis not present

## 2023-03-04 LAB — MICROALBUMIN / CREATININE URINE RATIO
Creatinine,U: 84.6 mg/dL
Microalb Creat Ratio: 0.8 mg/g (ref 0.0–30.0)
Microalb, Ur: <0.7 mg/dL (ref 0.0–1.9)

## 2023-03-04 LAB — COMPREHENSIVE METABOLIC PANEL
ALT: 17 U/L (ref 0–35)
AST: 15 U/L (ref 0–37)
Albumin: 4.1 g/dL (ref 3.5–5.2)
Alkaline Phosphatase: 83 U/L (ref 39–117)
BUN: 20 mg/dL (ref 6–23)
CO2: 29 meq/L (ref 19–32)
Calcium: 9.5 mg/dL (ref 8.4–10.5)
Chloride: 100 meq/L (ref 96–112)
Creatinine, Ser: 0.7 mg/dL (ref 0.40–1.20)
GFR: 95.28 mL/min (ref >60.00)
Glucose, Bld: 118 mg/dL — ABNORMAL HIGH (ref 70–99)
Potassium: 4.1 meq/L (ref 3.5–5.1)
Sodium: 137 meq/L (ref 135–145)
Total Bilirubin: 0.3 mg/dL (ref 0.2–1.2)
Total Protein: 7.5 g/dL (ref 6.0–8.3)

## 2023-03-04 LAB — LIPID PANEL
Cholesterol: 194 mg/dL (ref 0–200)
HDL: 84.8 mg/dL (ref >39.00)
LDL Cholesterol: 88 mg/dL (ref 0–99)
NonHDL: 108.9
Total CHOL/HDL Ratio: 2
Triglycerides: 105 mg/dL (ref 0.0–149.0)
VLDL: 21 mg/dL (ref 0.0–40.0)

## 2023-03-04 LAB — HEMOGLOBIN A1C: Hgb A1c MFr Bld: 6.5 % (ref 4.6–6.5)

## 2023-03-04 LAB — VITAMIN B12: Vitamin B-12: 444 pg/mL (ref 211–911)

## 2023-03-04 LAB — VITAMIN D 25 HYDROXY (VIT D DEFICIENCY, FRACTURES): VITD: 43.92 ng/mL (ref 30.00–100.00)

## 2023-03-09 ENCOUNTER — Other Ambulatory Visit: Payer: Self-pay

## 2023-03-09 ENCOUNTER — Emergency Department (HOSPITAL_COMMUNITY)
Admission: EM | Admit: 2023-03-09 | Discharge: 2023-03-09 | Disposition: A | Discharge status: Home / Self Care | Payer: 59 | Attending: Emergency Medicine | Admitting: Emergency Medicine

## 2023-03-09 ENCOUNTER — Emergency Department (HOSPITAL_COMMUNITY): Payer: 59

## 2023-03-09 DIAGNOSIS — R0789 Other chest pain: Secondary | ICD-10-CM | POA: Diagnosis present

## 2023-03-09 DIAGNOSIS — Y92410 Unspecified street and highway as the place of occurrence of the external cause: Secondary | ICD-10-CM | POA: Insufficient documentation

## 2023-03-09 MED ORDER — ACETAMINOPHEN 500 MG PO TABS
1000.0000 mg | ORAL_TABLET | Freq: Once | ORAL | Status: AC
Start: 2023-03-09 — End: 2023-03-09
  Administered 2023-03-09: 1000 mg via ORAL
  Filled 2023-03-09: qty 2

## 2023-03-09 MED ORDER — CYCLOBENZAPRINE HCL 10 MG PO TABS: 10.0000 mg | ORAL_TABLET | Freq: Two times a day (BID) | ORAL | 0 refills | Status: DC | PRN

## 2023-03-09 MED ORDER — ACETAMINOPHEN 325 MG PO TABS: 650.0000 mg | ORAL_TABLET | Freq: Four times a day (QID) | ORAL | 0 refills | Status: AC | PRN

## 2023-03-09 MED ORDER — ACETAMINOPHEN 325 MG PO TABS: 650.0000 mg | ORAL_TABLET | Freq: Four times a day (QID) | ORAL | 0 refills | Status: DC | PRN

## 2023-03-09 MED ORDER — CYCLOBENZAPRINE HCL 10 MG PO TABS
5.0000 mg | ORAL_TABLET | Freq: Once | ORAL | Status: AC
  Administered 2023-03-09: 5 mg via ORAL
  Filled 2023-03-09: qty 1
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
## 2023-03-11 ENCOUNTER — Encounter: Payer: Self-pay | Admitting: Family Medicine

## 2023-03-12 ENCOUNTER — Encounter: Payer: Self-pay | Admitting: Family Medicine

## 2023-03-12 ENCOUNTER — Ambulatory Visit (INDEPENDENT_AMBULATORY_CARE_PROVIDER_SITE_OTHER)
Admission: RE | Admit: 2023-03-12 | Discharge: 2023-03-12 | Disposition: A | Discharge status: Home / Self Care | Payer: 59 | Source: Ambulatory Visit | Attending: Family Medicine | Admitting: Family Medicine

## 2023-03-12 ENCOUNTER — Ambulatory Visit: Payer: 59 | Admitting: Family Medicine

## 2023-03-12 VITALS — BP 102/70 | HR 114 | Temp 98.2°F | Ht 60.25 in | Wt 211.2 lb

## 2023-03-12 DIAGNOSIS — M545 Low back pain, unspecified: Secondary | ICD-10-CM

## 2023-03-12 DIAGNOSIS — M25512 Pain in left shoulder: Secondary | ICD-10-CM

## 2023-03-12 DIAGNOSIS — R1084 Generalized abdominal pain: Secondary | ICD-10-CM

## 2023-03-12 DIAGNOSIS — E1169 Type 2 diabetes mellitus with other specified complication: Secondary | ICD-10-CM

## 2023-03-12 DIAGNOSIS — M542 Cervicalgia: Secondary | ICD-10-CM | POA: Diagnosis not present

## 2023-03-12 MED ORDER — PREDNISONE 20 MG PO TABS: 20.0000 mg | ORAL_TABLET | Freq: Every day | ORAL | 0 refills | Status: DC

## 2023-03-12 MED ORDER — TRAMADOL HCL 50 MG PO TABS
25.0000 mg | ORAL_TABLET | Freq: Three times a day (TID) | ORAL | 0 refills | Status: DC | PRN
Start: 2023-03-12 — End: 2023-05-25

## 2023-03-12 NOTE — Patient Instructions (Signed)
OK to use Lidocaine patches or cream
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
## 2023-03-18 ENCOUNTER — Ambulatory Visit (INDEPENDENT_AMBULATORY_CARE_PROVIDER_SITE_OTHER): Payer: 59 | Admitting: Family Medicine

## 2023-03-18 ENCOUNTER — Encounter: Payer: Self-pay | Admitting: Family Medicine

## 2023-03-18 VITALS — BP 122/84 | HR 91 | Temp 98.2°F | Ht 60.75 in | Wt 208.0 lb

## 2023-03-18 DIAGNOSIS — Z0000 Encounter for general adult medical examination without abnormal findings: Secondary | ICD-10-CM

## 2023-03-18 DIAGNOSIS — E559 Vitamin D deficiency, unspecified: Secondary | ICD-10-CM | POA: Diagnosis not present

## 2023-03-18 DIAGNOSIS — E1169 Type 2 diabetes mellitus with other specified complication: Secondary | ICD-10-CM

## 2023-03-18 DIAGNOSIS — Z1211 Encounter for screening for malignant neoplasm of colon: Secondary | ICD-10-CM

## 2023-03-18 DIAGNOSIS — H401133 Primary open-angle glaucoma, bilateral, severe stage: Secondary | ICD-10-CM

## 2023-03-18 MED ORDER — VITAMIN D3 1.25 MG (50000 UT) PO TABS: 1.0000 | ORAL_TABLET | ORAL | 4 refills | Status: DC

## 2023-03-18 NOTE — Assessment & Plan Note (Signed)
Encouraged healthy diet choices to affect sustainable weight loss.

## 2023-03-18 NOTE — Assessment & Plan Note (Signed)
Chronic, deteriorated A1c control. She will renew efforts to follow low sugar low carb diabetic diet.

## 2023-03-18 NOTE — Assessment & Plan Note (Signed)
Continue vit D 50k u weekly

## 2023-03-18 NOTE — Patient Instructions (Addendum)
[facility] a [facility] [phone].   Pediremos records del ultimo mamograma Revise en trabajo sobre ultima vacuna de tetano  Azucar estaba elevada hoy. Baje azucar en la dieta, harinas.  Regresar en 6 meses para control de diabetes.
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
## 2023-03-18 NOTE — Assessment & Plan Note (Signed)
Preventative protocols reviewed and updated unless pt declined. Discussed healthy diet and lifestyle.  

## 2023-03-18 NOTE — Assessment & Plan Note (Signed)
Appreciate ophtho care.  

## 2023-03-24 ENCOUNTER — Encounter: Payer: Self-pay | Admitting: Obstetrics and Gynecology
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
## 2023-03-26 ENCOUNTER — Encounter: Payer: Self-pay | Admitting: Family Medicine

## 2023-03-26 ENCOUNTER — Ambulatory Visit: Payer: 59 | Admitting: Family Medicine

## 2023-03-26 VITALS — BP 120/80 | HR 88 | Temp 98.3°F | Ht 60.25 in | Wt 211.5 lb

## 2023-03-26 DIAGNOSIS — M542 Cervicalgia: Secondary | ICD-10-CM | POA: Diagnosis not present

## 2023-03-26 DIAGNOSIS — M545 Low back pain, unspecified: Secondary | ICD-10-CM | POA: Diagnosis not present

## 2023-03-26 DIAGNOSIS — M25512 Pain in left shoulder: Secondary | ICD-10-CM

## 2023-03-26 MED ORDER — CYCLOBENZAPRINE HCL 10 MG PO TABS: 10.0000 mg | ORAL_TABLET | Freq: Two times a day (BID) | ORAL | 1 refills | Status: DC | PRN

## 2023-03-27 ENCOUNTER — Encounter: Payer: Self-pay | Admitting: Family Medicine
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
## 2023-03-31 ENCOUNTER — Other Ambulatory Visit: Payer: Self-pay

## 2023-03-31 ENCOUNTER — Ambulatory Visit: Payer: Self-pay | Attending: Family Medicine

## 2023-03-31 DIAGNOSIS — M5441 Lumbago with sciatica, right side: Secondary | ICD-10-CM | POA: Insufficient documentation

## 2023-03-31 DIAGNOSIS — M25512 Pain in left shoulder: Secondary | ICD-10-CM | POA: Diagnosis not present

## 2023-03-31 DIAGNOSIS — R262 Difficulty in walking, not elsewhere classified: Secondary | ICD-10-CM | POA: Diagnosis present

## 2023-03-31 DIAGNOSIS — R293 Abnormal posture: Secondary | ICD-10-CM | POA: Insufficient documentation

## 2023-03-31 DIAGNOSIS — M542 Cervicalgia: Secondary | ICD-10-CM | POA: Insufficient documentation
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
## 2023-04-14 ENCOUNTER — Ambulatory Visit: Payer: Self-pay | Attending: Family Medicine

## 2023-04-14 DIAGNOSIS — M5441 Lumbago with sciatica, right side: Secondary | ICD-10-CM

## 2023-04-14 DIAGNOSIS — R252 Cramp and spasm: Secondary | ICD-10-CM | POA: Insufficient documentation

## 2023-04-14 DIAGNOSIS — R262 Difficulty in walking, not elsewhere classified: Secondary | ICD-10-CM | POA: Diagnosis present

## 2023-04-14 DIAGNOSIS — M542 Cervicalgia: Secondary | ICD-10-CM | POA: Diagnosis present

## 2023-04-14 DIAGNOSIS — R293 Abnormal posture: Secondary | ICD-10-CM
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
## 2023-04-16 ENCOUNTER — Ambulatory Visit: Payer: Self-pay

## 2023-04-16 DIAGNOSIS — M5441 Lumbago with sciatica, right side: Secondary | ICD-10-CM

## 2023-04-16 DIAGNOSIS — M542 Cervicalgia: Secondary | ICD-10-CM

## 2023-04-16 DIAGNOSIS — R262 Difficulty in walking, not elsewhere classified: Secondary | ICD-10-CM

## 2023-04-16 DIAGNOSIS — R293 Abnormal posture: Secondary | ICD-10-CM
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
## 2023-04-21 ENCOUNTER — Ambulatory Visit: Payer: Self-pay

## 2023-04-21 DIAGNOSIS — M5441 Lumbago with sciatica, right side: Secondary | ICD-10-CM

## 2023-04-21 DIAGNOSIS — R262 Difficulty in walking, not elsewhere classified: Secondary | ICD-10-CM

## 2023-04-21 DIAGNOSIS — M542 Cervicalgia: Secondary | ICD-10-CM

## 2023-04-21 DIAGNOSIS — R293 Abnormal posture: Secondary | ICD-10-CM

## 2023-04-21 DIAGNOSIS — R252 Cramp and spasm: Secondary | ICD-10-CM

## 2023-04-23 ENCOUNTER — Ambulatory Visit: Payer: Self-pay

## 2023-04-23 DIAGNOSIS — M5441 Lumbago with sciatica, right side: Secondary | ICD-10-CM

## 2023-04-23 DIAGNOSIS — R262 Difficulty in walking, not elsewhere classified: Secondary | ICD-10-CM

## 2023-04-23 DIAGNOSIS — M542 Cervicalgia: Secondary | ICD-10-CM

## 2023-04-23 NOTE — Patient Instructions (Signed)
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
## 2023-04-28 ENCOUNTER — Ambulatory Visit: Payer: Self-pay

## 2023-04-28 DIAGNOSIS — M542 Cervicalgia: Secondary | ICD-10-CM

## 2023-04-28 DIAGNOSIS — R262 Difficulty in walking, not elsewhere classified: Secondary | ICD-10-CM

## 2023-04-28 DIAGNOSIS — R252 Cramp and spasm: Secondary | ICD-10-CM

## 2023-04-28 DIAGNOSIS — M5441 Lumbago with sciatica, right side: Secondary | ICD-10-CM

## 2023-04-28 DIAGNOSIS — R293 Abnormal posture: Secondary | ICD-10-CM
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
## 2023-04-29 ENCOUNTER — Encounter: Payer: Self-pay | Admitting: Family Medicine

## 2023-04-29 ENCOUNTER — Ambulatory Visit (INDEPENDENT_AMBULATORY_CARE_PROVIDER_SITE_OTHER): Payer: Self-pay | Admitting: Family Medicine

## 2023-04-29 VITALS — BP 120/70 | HR 107 | Temp 97.9°F | Ht 60.25 in | Wt 211.0 lb

## 2023-04-29 DIAGNOSIS — M25512 Pain in left shoulder: Secondary | ICD-10-CM

## 2023-04-29 DIAGNOSIS — M542 Cervicalgia: Secondary | ICD-10-CM

## 2023-04-29 DIAGNOSIS — M545 Low back pain, unspecified: Secondary | ICD-10-CM

## 2023-04-30 ENCOUNTER — Ambulatory Visit: Payer: Self-pay
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
## 2023-05-07 ENCOUNTER — Ambulatory Visit: Payer: Self-pay

## 2023-05-07 DIAGNOSIS — R293 Abnormal posture: Secondary | ICD-10-CM

## 2023-05-07 DIAGNOSIS — M5441 Lumbago with sciatica, right side: Secondary | ICD-10-CM | POA: Diagnosis not present

## 2023-05-07 DIAGNOSIS — R262 Difficulty in walking, not elsewhere classified: Secondary | ICD-10-CM

## 2023-05-07 DIAGNOSIS — M542 Cervicalgia: Secondary | ICD-10-CM

## 2023-05-07 DIAGNOSIS — R252 Cramp and spasm: Secondary | ICD-10-CM

## 2023-05-12 ENCOUNTER — Ambulatory Visit: Payer: Self-pay

## 2023-05-14 ENCOUNTER — Ambulatory Visit: Payer: 59

## 2023-05-19 ENCOUNTER — Ambulatory Visit: Payer: 59

## 2023-05-21 ENCOUNTER — Ambulatory Visit: Payer: 59 | Attending: Family Medicine

## 2023-05-21 DIAGNOSIS — M542 Cervicalgia: Secondary | ICD-10-CM | POA: Diagnosis not present

## 2023-05-21 DIAGNOSIS — M5441 Lumbago with sciatica, right side: Secondary | ICD-10-CM | POA: Diagnosis not present

## 2023-05-21 DIAGNOSIS — R262 Difficulty in walking, not elsewhere classified: Secondary | ICD-10-CM | POA: Diagnosis not present

## 2023-05-21 DIAGNOSIS — R252 Cramp and spasm: Secondary | ICD-10-CM | POA: Diagnosis not present

## 2023-05-21 DIAGNOSIS — R293 Abnormal posture: Secondary | ICD-10-CM | POA: Diagnosis not present
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
## 2023-05-25 ENCOUNTER — Ambulatory Visit: Payer: 59 | Admitting: Internal Medicine

## 2023-05-25 ENCOUNTER — Encounter: Payer: Self-pay | Admitting: Internal Medicine

## 2023-05-25 VITALS — BP 112/64 | HR 101 | Temp 98.8°F | Ht 60.5 in | Wt 211.0 lb

## 2023-05-25 DIAGNOSIS — A319 Mycobacterial infection, unspecified: Secondary | ICD-10-CM | POA: Insufficient documentation

## 2023-05-25 MED ORDER — DOXYCYCLINE HYCLATE 100 MG PO TABS: 100.0000 mg | ORAL_TABLET | Freq: Two times a day (BID) | ORAL | 1 refills | Status: DC

## 2023-05-25 NOTE — Progress Notes (Signed)
 Subjective:    Patient ID: Julie Vazquez, female    DOB: 07-17-1965, 58 y.o.   MRN: [id number]  HPI Here due to respiratory illness  Has been sick for almost a month Thought it was just a cold Cough was the worst thing Trying over the counter meds-----mucinex  cough, tea with lemon/honey Now having some right ear pain--and decreased hearing (feels full of liquid) Cough keeping her up Fever just the first week---tylenol  and robitussin then Has aching --this has persisted Some SOB---gets exhausted feeling when trying to do stuff  No headache now--but has had some Some sore throat  Current Outpatient Medications on File Prior to Visit  Medication Sig Dispense Refill   acetaminophen  (TYLENOL ) 325 MG tablet Take 2 tablets (650 mg total) by mouth every 6 (six) hours as needed for up to 30 doses for moderate pain (pain score 4-6) or mild pain (pain score 1-3). 30 tablet 0   brimonidine (ALPHAGAN) 0.15 % ophthalmic solution Place 1 drop into both eyes in the morning and at bedtime.     Cholecalciferol (VITAMIN D3) 1.25 MG (50000 UT) TABS Take 1 tablet by mouth once a week. 12 tablet 4   cyclobenzaprine  (FLEXERIL ) 10 MG tablet Take 1 tablet (10 mg total) by mouth 2 (two) times daily as needed for muscle spasms. 30 tablet 1   latanoprost (XALATAN) 0.005 % ophthalmic solution Place 1 drop into both eyes at bedtime.     Multiple Vitamins-Minerals (MULTIVITAMIN PO) Take 1 tablet by mouth daily.     timolol (BETIMOL) 0.5 % ophthalmic solution Place 1 drop into both eyes 2 (two) times daily.     No current facility-administered medications on file prior to visit.    Allergies  Allergen Reactions   Ibuprofen Nausea And Vomiting   Tessalon  [Benzonatate ] Rash    Past Medical History:  Diagnosis Date   Anemia    Anxiety    Complication of anesthesia    COVID-19 virus infection 05/14/2020   GERD (gastroesophageal reflux disease)    History of peptic ulcer disease    remote   PONV  (postoperative nausea and vomiting)    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 told has this in past.   Vitamin D  deficiency     Past Surgical History:  Procedure Laterality Date   CESAREAN SECTION     COLONOSCOPY  09/05/2010   nodular prominence anterior, mobile, likely compression from retoverted uterus   CYST EXCISION N/A    back   GLAUCOMA SURGERY Bilateral 2023   laser ([facility])   LAPAROSCOPIC CHOLECYSTECTOMY SINGLE SITE WITH INTRAOPERATIVE CHOLANGIOGRAM N/A 12/28/2019   Procedure: LAPAROSCOPIC CHOLECYSTECTOMY SINGLE SITE WITH CHOLANGIOGRAM;  Surgeon: Sheldon Standing, MD;  Location: [facility];  Service: General;  Laterality: N/A;   MIDDLE EAR SURGERY  2000s    Family History  Problem Relation Age of Onset   Hypertension Mother    Diabetes Mother    Cancer Maternal Aunt        ovarian   Miscarriages / Stillbirths Maternal Uncle    Diabetes Maternal Grandmother    Coronary artery disease Maternal Grandfather        MI   Cancer Cousin        breast  Stroke Maternal Uncle    Stroke Brother    Cancer Sister        breast   Breast cancer Sister 31    Social History   Socioeconomic History   Marital status: Married    Spouse name: Not on file   Number of children: Not on file   Years of education: Not on file   Highest education level: Not on file  Occupational History   Not on file  Tobacco Use   Smoking status: Never   Smokeless tobacco: Never  Vaping Use   Vaping status: Never Used  Substance and Sexual Activity   Alcohol use: Yes    Alcohol/week: 0.0 standard drinks of alcohol    Comment: Social   Drug use: No   Sexual activity: Not on file  Other Topics Concern   Not on file  Social History Narrative   Dominican   Caffeine: 1-2  cups coffee/day   Lives with husband and 2 children   Occupation: Armed forces operational officer   Activity: wants to start going to gym   Diet: bread, fruits/vegetables daily, good water , red meat occasional, 2x/wk fish   Social Drivers of Corporate Investment Banker Strain: Not on file  Food Insecurity: Not on file  Transportation Needs: Not on file  Physical Activity: Not on file  Stress: Not on file  Social Connections: Not on file  Intimate Partner Violence: Not on file   Review of Systems Some nausea--vomiting up phlegm (upon awakening)---green/brown stuff Is eating okay    Objective:   Physical Exam Constitutional:      Comments: Mildly ill appearing  HENT:     Head:     Comments: Mild right maxillary tenderness    Left Ear: Tympanic membrane and ear canal normal.     Ears:     Comments: Mild bullous changes in mid right TM---no inflammation    Mouth/Throat:     Pharynx: No oropharyngeal exudate or posterior oropharyngeal erythema.  Pulmonary:     Effort: Pulmonary effort is normal.     Breath sounds: Normal breath sounds. No wheezing or rales.  Musculoskeletal:     Cervical back: Neck supple.  Lymphadenopathy:     Cervical: No cervical adenopathy.  Neurological:     Mental Status: She is alert.            Assessment & Plan:

## 2023-05-25 NOTE — Assessment & Plan Note (Signed)
 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

## 2023-05-26 ENCOUNTER — Ambulatory Visit: Payer: No Typology Code available for payment source

## 2023-05-28 ENCOUNTER — Ambulatory Visit: Payer: No Typology Code available for payment source | Admitting: Physical Therapy
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
## 2023-06-02 ENCOUNTER — Ambulatory Visit: Payer: 59

## 2023-06-02 DIAGNOSIS — R293 Abnormal posture: Secondary | ICD-10-CM | POA: Diagnosis not present

## 2023-06-02 DIAGNOSIS — R252 Cramp and spasm: Secondary | ICD-10-CM

## 2023-06-02 DIAGNOSIS — M542 Cervicalgia: Secondary | ICD-10-CM

## 2023-06-02 DIAGNOSIS — R262 Difficulty in walking, not elsewhere classified: Secondary | ICD-10-CM | POA: Diagnosis not present

## 2023-06-02 DIAGNOSIS — M5441 Lumbago with sciatica, right side: Secondary | ICD-10-CM

## 2023-06-02 NOTE — Patient Instructions (Signed)
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
## 2023-06-04 ENCOUNTER — Ambulatory Visit: Payer: 59

## 2023-06-04 DIAGNOSIS — M5441 Lumbago with sciatica, right side: Secondary | ICD-10-CM

## 2023-06-04 DIAGNOSIS — M542 Cervicalgia: Secondary | ICD-10-CM

## 2023-06-04 DIAGNOSIS — R262 Difficulty in walking, not elsewhere classified: Secondary | ICD-10-CM | POA: Diagnosis not present

## 2023-06-04 DIAGNOSIS — R252 Cramp and spasm: Secondary | ICD-10-CM

## 2023-06-04 DIAGNOSIS — R293 Abnormal posture: Secondary | ICD-10-CM | POA: Diagnosis not present

## 2023-08-04 DIAGNOSIS — H11132 Conjunctival pigmentations, left eye: Secondary | ICD-10-CM | POA: Diagnosis not present

## 2023-08-04 DIAGNOSIS — H401133 Primary open-angle glaucoma, bilateral, severe stage: Secondary | ICD-10-CM | POA: Diagnosis not present

## 2023-08-04 LAB — HM DIABETES EYE EXAM

## 2023-08-06 DIAGNOSIS — Z1231 Encounter for screening mammogram for malignant neoplasm of breast: Secondary | ICD-10-CM | POA: Diagnosis not present

## 2023-08-06 DIAGNOSIS — Z01419 Encounter for gynecological examination (general) (routine) without abnormal findings: Secondary | ICD-10-CM | POA: Diagnosis not present

## 2023-08-06 DIAGNOSIS — Z6839 Body mass index (BMI) 39.0-39.9, adult: Secondary | ICD-10-CM | POA: Diagnosis not present

## 2023-08-06 LAB — HM MAMMOGRAPHY

## 2023-09-15 ENCOUNTER — Ambulatory Visit: Payer: 59 | Admitting: Family Medicine

## 2023-09-15 ENCOUNTER — Encounter: Payer: Self-pay | Admitting: Family Medicine

## 2023-09-15 VITALS — BP 138/80 | HR 81 | Temp 98.5°F | Ht 60.5 in | Wt 211.0 lb

## 2023-09-15 DIAGNOSIS — E1169 Type 2 diabetes mellitus with other specified complication: Secondary | ICD-10-CM | POA: Diagnosis not present

## 2023-09-15 DIAGNOSIS — H401133 Primary open-angle glaucoma, bilateral, severe stage: Secondary | ICD-10-CM | POA: Diagnosis not present

## 2023-09-15 LAB — POCT GLYCOSYLATED HEMOGLOBIN (HGB A1C): Hemoglobin A1C: 6.3 % — AB (ref 4.0–5.6)

## 2023-09-15 MED ORDER — CETIRIZINE HCL 10 MG PO TABS: 10.0000 mg | ORAL_TABLET | Freq: Every day | ORAL | Status: AC

## 2023-09-15 NOTE — Patient Instructions (Addendum)
 We will request latest records from Dr Candi Chafe Siga dieta baja en azucar/ carbohidratos [place] he dado "diabetes care checklist" La remitire a nutricionista en [facility].  Gusto verla hoy.  Regresar en 6 meses para proximo examen fisico.

## 2023-09-15 NOTE — Assessment & Plan Note (Signed)
Appreciate eye care.

## 2023-09-15 NOTE — Progress Notes (Signed)
 Ph: [phone] Fax: [phone]   Patient ID: Julie Vazquez, female    DOB: 1965/05/28, 58 y.o.   MRN: [id number]  This visit was conducted in person.  BP 138/80   Pulse 81   Temp 98.5 F (36.9 C) (Oral)   Ht 5' 0.5" (1.537 m)   Wt 211 lb (95.7 kg)   LMP 02/14/2015   SpO2 95%   BMI 40.53 kg/m    CC: 6 mo DM f/u visit  Subjective:   HPI: Julie Vazquez is a 58 y.o. female presenting on 09/15/2023 for Diabetes (54mo F/U; been doing okau; no problems with sugar; no new concerns)   Grandmother (103yo) then local SIL (cancer) died recently  Saw GYN 08-03-2023 for well woman exam.   DM - does not regularly check sugars. Compliant with antihyperglycemic regimen which includes: diet controlled - limits bread, added sugars, sweetened beverages. Denies low sugars or hypoglycemic symptoms. Denies paresthesias, blurry vision. Last diabetic eye exam 10/2022. Glucometer brand: doesn't have this. Last foot exam: 05/2022 - DUE. DSME: agrees to referral. Planning to restart going to the gym. She does wear below the knee compression Lab Results  Component Value Date   HGBA1C 6.3 (A) 09/15/2023   Diabetic Foot Exam - Simple   Simple Foot Form Diabetic Foot exam was performed with the following findings: Yes 09/15/2023  8:18 AM  Visual Inspection No deformities, no ulcerations, no other skin breakdown bilaterally: Yes Sensation Testing Intact to touch and monofilament testing bilaterally: Yes Pulse Check Posterior Tibialis and Dorsalis pulse intact bilaterally: Yes Comments No claudication    Lab Results  Component Value Date   MICROALBUR <0.7 03/04/2023         Relevant past medical, surgical, family and social history reviewed and updated as indicated. Interim medical history since our last visit reviewed. Allergies and medications reviewed and updated. Outpatient Medications Prior to Visit  Medication Sig Dispense Refill   brimonidine (ALPHAGAN) 0.15 % ophthalmic solution Place 1  drop into both eyes in the morning and at bedtime.     Cholecalciferol (VITAMIN D3) 1.25 MG (50000 UT) TABS Take 1 tablet by mouth once a week. 12 tablet 4   cyclobenzaprine  (FLEXERIL ) 10 MG tablet Take 1 tablet (10 mg total) by mouth 2 (two) times daily as needed for muscle spasms. 30 tablet 1   dorzolamide-timolol (COSOPT) 2-0.5 % ophthalmic solution Place 1 drop into both eyes 2 (two) times daily.     latanoprost (XALATAN) 0.005 % ophthalmic solution Place 1 drop into both eyes at bedtime.     Multiple Vitamins-Minerals (MULTIVITAMIN PO) Take 1 tablet by mouth daily.     timolol (BETIMOL) 0.5 % ophthalmic solution Place 1 drop into both eyes 2 (two) times daily.     acetaminophen  (TYLENOL ) 325 MG tablet Take 2 tablets (650 mg total) by mouth every 6 (six) hours as needed for up to 30 doses for moderate pain (pain score 4-6) or mild pain (pain score 1-3). (Patient not taking: Reported on 09/15/2023) 30 tablet 0   doxycycline  (VIBRA -TABS) 100 MG tablet Take 1 tablet (100 mg total) by mouth 2 (two) times daily. (Patient not taking: Reported on 09/15/2023) 14 tablet 1   No facility-administered medications prior to visit.     Per HPI unless specifically indicated in ROS section below Review of Systems  Objective:  BP 138/80   Pulse 81   Temp 98.5 F (36.9 C) (Oral)   Ht 5' 0.5" (1.537 m)   Wt 211  lb (95.7 kg)   LMP 02/14/2015   SpO2 95%   BMI 40.53 kg/m   Wt Readings from Last 3 Encounters:  09/15/23 211 lb (95.7 kg)  05/25/23 211 lb (95.7 kg)  04/29/23 211 lb (95.7 kg)      Physical Exam Vitals and nursing note reviewed.  Constitutional:      Appearance: Normal appearance. She is not ill-appearing.  HENT:     Head: Normocephalic and atraumatic.     Mouth/Throat:     Mouth: Mucous membranes are moist.     Pharynx: Oropharynx is clear. No oropharyngeal exudate or posterior oropharyngeal erythema.  Eyes:     Extraocular Movements: Extraocular movements intact.      Conjunctiva/sclera: Conjunctivae normal.     Pupils: Pupils are equal, round, and reactive to light.  Cardiovascular:     Rate and Rhythm: Normal rate and regular rhythm.     Pulses: Normal pulses.     Heart sounds: Normal heart sounds. No murmur heard. Pulmonary:     Effort: Pulmonary effort is normal. No respiratory distress.     Breath sounds: Normal breath sounds. No wheezing, rhonchi or rales.  Musculoskeletal:     Right lower leg: No edema.     Left lower leg: No edema.     Comments: See HPI for foot exam if done  Lymphadenopathy:     Head:     Right side of head: No submental, submandibular, tonsillar, preauricular or posterior auricular adenopathy.     Left side of head: No submental, submandibular, tonsillar, preauricular or posterior auricular adenopathy.     Cervical:     Right cervical: No superficial cervical adenopathy.    Left cervical: No superficial cervical adenopathy.     Upper Body:     Right upper body: No supraclavicular adenopathy.     Left upper body: No supraclavicular adenopathy.  Skin:    General: Skin is warm and dry.     Findings: No rash.  Neurological:     Mental Status: She is alert.  Psychiatric:        Mood and Affect: Mood normal.        Behavior: Behavior normal.       Results for orders placed or performed in visit on 09/15/23  POCT glycosylated hemoglobin (Hb A1C)   Collection Time: 09/15/23  8:26 AM  Result Value Ref Range   Hemoglobin A1C 6.3 (A) 4.0 - 5.6 %   HbA1c POC (<> result, manual entry)     HbA1c, POC (prediabetic range)     HbA1c, POC (controlled diabetic range)        09/15/2023    8:06 AM 03/18/2023   12:28 PM 10/09/2022    4:22 PM 05/30/2022    3:13 PM 02/28/2022    3:39 PM  Depression screen PHQ 2/9  Decreased Interest 0 0 0 0 0  Down, Depressed, Hopeless  0 0 0 0  PHQ - 2 Score 0 0 0 0 0  Altered sleeping 2  0    Tired, decreased energy 2  2    Change in appetite 0  0    Feeling bad or failure about yourself  0   0    Trouble concentrating 0  0    Moving slowly or fidgety/restless 0  0    Suicidal thoughts 0  0    PHQ-9 Score 4  2    Difficult doing work/chores Not difficult at all  Not difficult at all  Assessment & Plan:   Problem List Items Addressed This Visit     Obesity, morbid, BMI 40.0-49.9 (HCC)   Encourage healthy diet and lifestyle choices to affect sustainable weight loss.       Type 2 diabetes mellitus with other specified complication (HCC) - Primary   Chronic, stable period, diet controlled. Update A1c - 6.3%ic diet.  Offered DMSE - she agrees. Will refer to [facility] Diabetes care checklist handout provided today.       Relevant Orders   POCT glycosylated hemoglobin (Hb A1C) (Completed)   Ambulatory referral to diabetic education   Primary open angle glaucoma (POAG) of both eyes, severe stage   Appreciate eye care.      Relevant Medications   dorzolamide-timolol (COSOPT) 2-0.5 % ophthalmic solution     Meds ordered this encounter  Medications   cetirizine (ZYRTEC) 10 MG tablet    Sig: Take 1 tablet (10 mg total) by mouth daily.    Orders Placed This Encounter  Procedures   Ambulatory referral to diabetic education    Referral Priority:   Routine    Referral Type:   Consultation    Referral Reason:   Specialty Services Required    Number of Visits Requested:   1   POCT glycosylated hemoglobin (Hb A1C)    Patient Instructions  We will request latest records from Dr Candi Chafe Siga dieta baja en azucar/ carbohidratos [place] he dado "diabetes care checklist" La remitire a nutricionista en [facility].  Gusto verla hoy.  Regresar en 6 meses para proximo examen fisico.   Follow up plan: Return in about 6 months (around 03/17/2024) for annual exam, prior fasting for blood work.  Claire Crick, MD

## 2023-09-15 NOTE — Assessment & Plan Note (Addendum)
 Chronic, stable period, diet controlled. Update A1c - 6.3%ic diet.  Offered DMSE - she agrees. Will refer to [facility] Diabetes care checklist handout provided today.

## 2023-09-15 NOTE — Assessment & Plan Note (Signed)
 Encourage healthy diet and lifestyle choices to affect sustainable weight loss

## 2023-10-07 ENCOUNTER — Encounter: Payer: Self-pay | Admitting: Skilled Nursing Facility1

## 2023-10-07 ENCOUNTER — Encounter: Payer: Self-pay | Attending: Family Medicine | Admitting: Skilled Nursing Facility1

## 2023-10-07 VITALS — Ht 61.5 in | Wt 212.0 lb

## 2023-10-07 DIAGNOSIS — E1169 Type 2 diabetes mellitus with other specified complication: Secondary | ICD-10-CM | POA: Diagnosis present

## 2023-10-07 NOTE — Progress Notes (Signed)
 Pt states she wants to lose weight and learn how to eat with DM.  Pt states she lives with her husband and her son and his girlfriend.  Pt states her sister is sick so is caring for her. Pt state she does not check her blood sugar.  Pt state she does not sleep due to her mind running.  Pt states she helps her sister take care of her mom and get her ready for bed. Pt states she has not taken a vacation in 3 years.  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  Pt states she has a sensitive stomach to greasy food.  Pt states she is thinking about going to the [facility] stating her son might go with her.   A1C 6.3  Diabetes Self-Management Education  Visit Type: First/Initial  Appt. Start Time: 10:49 Appt. End Time: 11:49  10/07/2023  [name], identified by name and date of birth, is a 58 y.o. female with a diagnosis of Diabetes: Type 2.   ASSESSMENT  Height 5' 1.5" (1.562 m), weight 212 lb (96.2 kg), last menstrual period 02/14/2015. Body mass index is 39.41 kg/m.   Diabetes Self-Management Education - 10/07/23 1052       Visit Information   Visit Type First/Initial      Initial Visit   Diabetes Type Type 2    Are you currently following a meal plan? No    Are you taking your medications as prescribed? Not on Medications      Health Coping   How would you rate your overall health? Good      Psychosocial Assessment   Patient Belief/Attitude about Diabetes Motivated to manage diabetes    What is the hardest part about your diabetes right now, causing you the most concern, or is the most worrisome to you about your diabetes?   Making healty food and beverage choices    Self-care barriers None    Self-management support Family    Patient Concerns Nutrition/Meal planning    Special Needs None    Preferred Learning Style Auditory    Learning Readiness Contemplating    How often do you need to have someone help you when you read instructions, pamphlets, or other written  materials from your doctor or pharmacy? 1 - Never      Pre-Education Assessment   Patient understands the diabetes disease and treatment process. Needs Instruction    Patient understands incorporating nutritional management into lifestyle. Needs Instruction    Patient undertands incorporating physical activity into lifestyle. Needs Instruction    Patient understands using medications safely. Needs Instruction    Patient understands monitoring blood glucose, interpreting and using results Needs Instruction    Patient understands prevention, detection, and treatment of acute complications. Needs Instruction    Patient understands prevention, detection, and treatment of chronic complications. Needs Instruction    Patient understands how to develop strategies to address psychosocial issues. Needs Instruction    Patient understands how to develop strategies to promote health/change behavior. Needs Instruction      Complications   Last HgB A1C per patient/outside source 6.3 %    How often do you check your blood sugar? 0 times/day (not testing)    Number of hypoglycemic episodes per month 0    Have you had a dilated eye exam in the past 12 months? Yes    Have you had a dental exam in the past 12 months? Yes    Are you checking your feet? Yes  How many days per week are you checking your feet? 7      Dietary Intake   Breakfast coffee + lactaid milk + 2 eggs + papya    Snack (morning) fruit    Lunch salad: chicken or shrimp + green banana + broccoli + caluflower + brussles somtimes beans + balsalmic or olive oil    Snack (afternoon) coffee + lactaid    Dinner soup: beef + squash + carrots + cilantro    Beverage(s) water , coccnut      Patient Education   Previous Diabetes Education No    Disease Pathophysiology Definition of diabetes, type 1 and 2, and the diagnosis of diabetes;Factors that contribute to the development of diabetes    Healthy Eating Role of diet in the treatment of diabetes  and the relationship between the three main macronutrients and blood glucose level;Food label reading, portion sizes and measuring food.;Plate Method;Reviewed blood glucose goals for pre and post meals and how to evaluate the patients' food intake on their blood glucose level.;Meal timing in regards to the patients' current diabetes medication.;Effects of alcohol on blood glucose and safety factors with consumption of alcohol.;Information on hints to eating out and maintain blood glucose control.;Meal options for control of blood glucose level and chronic complications.    Being Active Role of exercise on diabetes management, blood pressure control and cardiac health.    Acute complications Taught prevention, symptoms, and  treatment of hypoglycemia - the 15 rule.;Discussed and identified patients' prevention, symptoms, and treatment of hyperglycemia.      Individualized Goals (developed by patient)   Physical Activity Exercise 5-7 days per week;30 minutes per day;45 minutes per day    Monitoring  Test my blood glucose as discussed;Test blood glucose pre and post meals as discussed    Problem Solving Eating Pattern;Sleep Pattern      Post-Education Assessment   Patient understands the diabetes disease and treatment process. Demonstrates understanding / competency    Patient understands incorporating nutritional management into lifestyle. Demonstrates understanding / competency    Patient undertands incorporating physical activity into lifestyle. Demonstrates understanding / competency    Patient understands using medications safely. Demonstrates understanding / competency    Patient understands monitoring blood glucose, interpreting and using results Demonstrates understanding / competency    Patient understands prevention, detection, and treatment of acute complications. Demonstrates understanding / competency    Patient understands prevention, detection, and treatment of chronic complications.  Demonstrates understanding / competency    Patient understands how to develop strategies to address psychosocial issues. Demonstrates understanding / competency    Patient understands how to develop strategies to promote health/change behavior. Demonstrates understanding / competency      Outcomes   Expected Outcomes Demonstrated interest in learning. Expect positive outcomes    Future DMSE 4-6 wks    Program Status Completed             Individualized Plan for Diabetes Self-Management Training:   Learning Objective:  Patient will have a greater understanding of diabetes self-management. Patient education plan is to attend individual and/or group sessions per assessed needs and concerns.    Expected Outcomes:  Demonstrated interest in learning. Expect positive outcomes  Education material provided: ADA - How to Thrive: A Guide for Your Journey with Diabetes, Food label handouts, Meal plan card, My Plate, and Snack sheet  If problems or questions, patient to contact team via:  Phone and Email  Future DSME appointment: 4-6 wks

## 2023-11-10 ENCOUNTER — Encounter: Payer: Self-pay | Admitting: Internal Medicine

## 2023-12-01 ENCOUNTER — Other Ambulatory Visit: Payer: Self-pay | Admitting: Family Medicine

## 2024-01-04 ENCOUNTER — Ambulatory Visit (AMBULATORY_SURGERY_CENTER): Payer: Self-pay

## 2024-01-04 VITALS — Ht 61.5 in | Wt 200.0 lb

## 2024-01-04 DIAGNOSIS — Z1211 Encounter for screening for malignant neoplasm of colon: Secondary | ICD-10-CM

## 2024-01-04 MED ORDER — PEG 3350-KCL-NA BICARB-NACL 420 G PO SOLR: 4000.0000 mL | Freq: Once | ORAL | 0 refills | Status: AC

## 2024-01-04 NOTE — Progress Notes (Signed)
 No egg or soy allergy known to patient  No issues known to pt with past sedation with any surgeries or procedures Patient denies ever being told they had issues or difficulty with intubation  No FH of Malignant Hyperthermia Pt is not on diet pills Pt is not on  home 02  Pt is not on blood thinners  Pt denies issues with constipation  No A fib or A flutter Have any cardiac testing pending--no  LOA: independent  Prep: golytely   Patient's chart reviewed by Cathlyn Parsons CNRA prior to previsit and patient appropriate for the [facility].  Previsit completed and red dot placed by patient's name on their procedure day (on provider's schedule).     PV completed with patient. Prep instructions sent via mychart and home address.

## 2024-01-06 ENCOUNTER — Encounter: Payer: Self-pay | Admitting: Internal Medicine

## 2024-01-11 HISTORY — PX: COLONOSCOPY: SHX174

## 2024-01-18 ENCOUNTER — Ambulatory Visit: Admitting: Internal Medicine

## 2024-01-18 ENCOUNTER — Encounter: Payer: Self-pay | Admitting: Internal Medicine

## 2024-01-18 VITALS — BP 130/91 | HR 74 | Temp 97.8°F | Resp 13 | Ht 61.5 in | Wt 200.0 lb

## 2024-01-18 DIAGNOSIS — Z1211 Encounter for screening for malignant neoplasm of colon: Secondary | ICD-10-CM | POA: Diagnosis not present

## 2024-01-18 DIAGNOSIS — F419 Anxiety disorder, unspecified: Secondary | ICD-10-CM | POA: Diagnosis not present

## 2024-01-18 DIAGNOSIS — R7303 Prediabetes: Secondary | ICD-10-CM | POA: Diagnosis not present

## 2024-01-18 MED ORDER — SODIUM CHLORIDE 0.9 % IV SOLN: 500.0000 mL | Freq: Once | INTRAVENOUS | Status: DC

## 2024-01-18 NOTE — Patient Instructions (Signed)
 YOU HAD AN ENDOSCOPIC PROCEDURE TODAY AT THE [facility]:   Refer to the procedure report that was given to you for any specific questions about what was found during the examination.  If the procedure report does not answer your questions, please call your gastroenterologist to clarify.  If you requested that your care partner not be given the details of your procedure findings, then the procedure report has been included in a sealed envelope for you to review at your convenience later.  YOU SHOULD EXPECT: Some feelings of bloating in the abdomen. Passage of more gas than usual.  Walking can help get rid of the air that was put into your GI tract during the procedure and reduce the bloating. If you had a lower endoscopy (such as a colonoscopy or flexible sigmoidoscopy) you may notice spotting of blood in your stool or on the toilet paper. If you underwent a bowel prep for your procedure, you may not have a normal bowel movement for a few days.  Please Note:  You might notice some irritation and congestion in your nose or some drainage.  This is from the oxygen used during your procedure.  There is no need for concern and it should clear up in a day or so.  SYMPTOMS TO REPORT IMMEDIATELY:  Following lower endoscopy (colonoscopy or flexible sigmoidoscopy):  Excessive amounts of blood in the stool  Significant tenderness or worsening of abdominal pains  Swelling of the abdomen that is new, acute  Fever of 100F or higher  Resume previous diet Continue present medications Repeat colonoscopy in 10 years   For urgent or emergent issues, a gastroenterologist can be reached at any hour by calling [phone]. Do not use MyChart messaging for urgent concerns.    DIET:  We do recommend a small meal at first, but then you may proceed to your regular diet.  Drink plenty of fluids but you should avoid alcoholic beverages for 24 hours.  ACTIVITY:  You should plan to take it easy for the  rest of today and you should NOT DRIVE or use heavy machinery until tomorrow (because of the sedation medicines used during the test).    FOLLOW UP: Our staff will call the number listed on your records the next business day following your procedure.  We will call around 7:15- 8:00 am to check on you and address any questions or concerns that you may have regarding the information given to you following your procedure. If we do not reach you, we will leave a message.     If any biopsies were taken you will be contacted by phone or by letter within the next 1-3 weeks.  Please call us  at [phone] if you have not heard about the biopsies in 3 weeks.    SIGNATURES/CONFIDENTIALITY: You and/or your care partner have signed paperwork which will be entered into your electronic medical record.  These signatures attest to the fact that that the information above on your After Visit Summary has been reviewed and is understood.  Full responsibility of the confidentiality of this discharge information lies with you and/or your care-partner.

## 2024-01-18 NOTE — Op Note (Signed)
 [facility] Patient Name: Julie Vazquez Procedure Date: 01/18/2024 9:01 AM MRN: [id number] Endoscopist: Gordy CHRISTELLA Starch , MD, [id number] Age: 58 Referring MD:  Date of Birth: [birth year] Gender: Female Account #: [id number] Procedure:                Colonoscopy Indications:              Screening for colorectal malignant neoplasm, Last                            colonoscopy 10 years ago Medicines:                Monitored Anesthesia Care Procedure:                Pre-Anesthesia Assessment:                           - Prior to the procedure, a History and Physical                            was performed, and patient medications and                            allergies were reviewed. The patient's tolerance of                            previous anesthesia was also reviewed. The risks                            and benefits of the procedure and the sedation                            options and risks were discussed with the patient.                            All questions were answered, and informed consent                            was obtained. Prior Anticoagulants: The patient has                            taken no anticoagulant or antiplatelet agents. ASA                            Grade Assessment: II - A patient with mild systemic                            disease. After reviewing the risks and benefits,                            the patient was deemed in satisfactory condition to                            undergo the procedure.  After obtaining informed consent, the colonoscope                            was passed under direct vision. Throughout the                            procedure, the patient's blood pressure, pulse, and                            oxygen saturations were monitored continuously. The                            CF [id number] #[id number] was introduced through the anus                            and advanced to the cecum,  identified by                            appendiceal orifice and ileocecal valve. The                            colonoscopy was performed without difficulty. The                            patient tolerated the procedure well. The quality                            of the bowel preparation was good. The ileocecal                            valve, appendiceal orifice, and rectum were                            photographed. Scope In: 9:09:06 AM Scope Out: 9:20:29 AM Scope Withdrawal Time: 0 hours 8 minutes 51 seconds  Total Procedure Duration: 0 hours 11 minutes 23 seconds  Findings:                 The digital rectal exam was normal.                           The entire examined colon appeared normal on direct                            and retroflexion views. Complications:            No immediate complications. Estimated Blood Loss:     Estimated blood loss: none. Impression:               - The entire examined colon is normal on direct and                            retroflexion views.                           - No specimens collected. Recommendation:           -  Patient has a contact number available for                            emergencies. The signs and symptoms of potential                            delayed complications were discussed with the                            patient. Return to normal activities tomorrow.                            Written discharge instructions were provided to the                            patient.                           - Resume previous diet.                           - Continue present medications.                           - Repeat colonoscopy in 10 years for screening                            purposes. Gordy CHRISTELLA Starch, MD 01/18/2024 9:21:55 AM This report has been signed electronically.

## 2024-01-18 NOTE — Progress Notes (Signed)
 Pt's states no medical or surgical changes since previsit or office visit.

## 2024-01-18 NOTE — Progress Notes (Signed)
 GASTROENTEROLOGY PROCEDURE H&P NOTE   Primary Care Physician: Rilla Baller, MD    Reason for Procedure:  Colon cancer screening  Plan:    Colonoscopy  Patient is appropriate for endoscopic procedure(s) in the ambulatory ([facility]) setting.  The nature of the procedure, as well as the risks, benefits, and alternatives were carefully and thoroughly reviewed with the patient. Ample time for discussion and questions allowed. The patient understood, was satisfied, and agreed to proceed.     HPI: Julie Vazquez is a 58 y.o. female who presents for screening colonoscopy.  Medical history as below.  Tolerated the prep.  No recent chest pain or shortness of breath.  No abdominal pain today.  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 told has this in past.   Vitamin D  deficiency     Past Surgical History:  Procedure Laterality Date   CESAREAN SECTION     COLONOSCOPY  09/05/2010   nodular prominence anterior, mobile, likely compression from retoverted uterus   CYST EXCISION N/A    back   GLAUCOMA SURGERY Bilateral 2023   laser ([facility])   LAPAROSCOPIC CHOLECYSTECTOMY SINGLE SITE WITH INTRAOPERATIVE CHOLANGIOGRAM N/A 12/28/2019   Procedure: LAPAROSCOPIC CHOLECYSTECTOMY SINGLE SITE WITH CHOLANGIOGRAM;  Surgeon: Sheldon Standing, MD;  Location: [facility];  Service: General;  Laterality: N/A;   MIDDLE EAR SURGERY  2000s    Prior to Admission medications   Medication Sig Start Date End Date Taking?  Authorizing Provider  brimonidine (ALPHAGAN) 0.15 % ophthalmic solution Place 1 drop into both eyes in the morning and at bedtime. 02/28/22  Yes Rilla Baller, MD  Cholecalciferol (VITAMIN D3) 1.25 MG (50000 UT) TABS Take 1 tablet by mouth once a week. 03/18/23  Yes Rilla Baller, MD  dorzolamide-timolol (COSOPT) 2-0.5 % ophthalmic solution Place 1 drop into both eyes 2 (two) times daily. 09/15/23  Yes Rilla Baller, MD  latanoprost (XALATAN) 0.005 % ophthalmic solution Place 1 drop into both eyes at bedtime. 02/28/22  Yes Rilla Baller, MD  Multiple Vitamins-Minerals (MULTIVITAMIN PO) Take 1 tablet by mouth daily.   Yes [provider]  Vitamin D , Ergocalciferol , (DRISDOL) 1.25 MG (50000 UNIT) CAPS capsule Take 50,000 Units by mouth once a week. 12/30/23  Yes [provider]  acetaminophen  (TYLENOL ) 325 MG tablet Take 2 tablets (650 mg total) by mouth every 6 (six) hours as needed for up to 30 doses for moderate pain (pain score 4-6) or mild pain (pain score 1-3). Patient not taking: Reported on 09/15/2023 03/09/23   Cottie Donnice PARAS, MD  cetirizine  (ZYRTEC ) 10 MG tablet Take 1 tablet (10 mg total) by mouth daily. Patient not taking: No sig reported 09/15/23   Rilla Baller, MD  cyclobenzaprine  (FLEXERIL ) 10 MG tablet TAKE 1 TABLET BY MOUTH TWICE A DAY AS NEEDED FOR MUSCLE SPASMS 12/01/23   Watt Mirza, MD    Current Outpatient Medications  Medication Sig Dispense Refill   brimonidine (  ALPHAGAN) 0.15 % ophthalmic solution Place 1 drop into both eyes in the morning and at bedtime.     Cholecalciferol (VITAMIN D3) 1.25 MG (50000 UT) TABS Take 1 tablet by mouth once a week. 12 tablet 4   dorzolamide-timolol (COSOPT) 2-0.5 % ophthalmic solution Place 1 drop into both eyes 2 (two) times daily.     latanoprost (XALATAN) 0.005 % ophthalmic solution Place 1 drop into both eyes at bedtime.     Multiple Vitamins-Minerals (MULTIVITAMIN PO) Take 1 tablet by mouth daily.      Vitamin D , Ergocalciferol , (DRISDOL) 1.25 MG (50000 UNIT) CAPS capsule Take 50,000 Units by mouth once a week.     acetaminophen  (TYLENOL ) 325 MG tablet Take 2 tablets (650 mg total) by mouth every 6 (six) hours as needed for up to 30 doses for moderate pain (pain score 4-6) or mild pain (pain score 1-3). (Patient not taking: Reported on 09/15/2023) 30 tablet 0   cetirizine  (ZYRTEC ) 10 MG tablet Take 1 tablet (10 mg total) by mouth daily. (Patient not taking: No sig reported)     cyclobenzaprine  (FLEXERIL ) 10 MG tablet TAKE 1 TABLET BY MOUTH TWICE A DAY AS NEEDED FOR MUSCLE SPASMS 30 tablet 1   Current Facility-Administered Medications  Medication Dose Route Frequency Provider Last Rate Last Admin   0.9 %  sodium chloride  infusion  500 mL Intravenous Once [place], Gordy HERO, MD        Allergies as of 01/18/2024 - Review Complete 01/18/2024  Allergen Reaction Noted   Ibuprofen Nausea And Vomiting 12/28/2019   Tessalon  [benzonatate ] Rash 03/08/2015    Family History  Problem Relation Age of Onset   Hypertension Mother    Diabetes Mother    Esophageal cancer Sister    Cancer Sister        breast   Breast cancer Sister 76   Stroke Brother    Cancer Maternal Aunt        ovarian   Miscarriages / Stillbirths Maternal Uncle    Stroke Maternal Uncle    Diabetes Maternal Grandmother    Coronary artery disease Maternal Grandfather        MI   Cancer Cousin        breast   Colon cancer Neg Hx    Rectal cancer Neg Hx    Stomach cancer Neg Hx     Social History   Socioeconomic History   Marital status: Married    Spouse name: Not on file   Number of children: Not on file   Years of education: Not on file   Highest education level: Not on file  Occupational History   Not on file  Tobacco Use   Smoking status: Never   Smokeless tobacco: Never  Vaping Use   Vaping status: Never Used  Substance and Sexual Activity   Alcohol use: Yes    Alcohol/week: 0.0 standard drinks of alcohol     Comment: Social   Drug use: No   Sexual activity: Not on file  Other Topics Concern   Not on file  Social History Narrative   Dominican   Caffeine: 1-2 cups coffee/day   Lives with husband and 2 children   Occupation: Armed forces operational officer   Activity: wants to start going to gym   Diet: bread, fruits/vegetables daily, good water , red meat occasional, 2x/wk fish   Social Drivers of Corporate investment banker Strain: Not on file  Food Insecurity: Not on file  Transportation Needs: Not on file  Physical Activity: Not on file  Stress: Not on file  Social Connections: Not on file  Intimate Partner Violence: Not on file    Physical Exam: Vital signs in last 24 hours: @BP  (!) 148/87   Pulse 81   Temp 97.8 F (36.6 C)   Ht 5' 1.5 (1.562 m)   Wt 200 lb (90.7 kg)   LMP 02/14/2015   SpO2 96%   BMI 37.18 kg/m  GEN: NAD EYE: Sclerae anicteric ENT: MMM CV: Non-tachycardic Pulm: CTA b/l GI: Soft, NT/ND NEURO:  Alert & Oriented x 3   Gordy Starch, MD [facility]  01/18/2024 8:55 AM

## 2024-01-18 NOTE — Progress Notes (Signed)
 Report to PACU, RN, vss, BBS= Clear.

## 2024-01-19 ENCOUNTER — Telehealth: Payer: Self-pay | Admitting: *Deleted

## 2024-01-19 NOTE — Telephone Encounter (Signed)
  Follow up Call-     01/18/2024    8:28 AM  Call back number  Post procedure Call Back phone  # [phone]  Permission to leave phone message Yes     Patient questions:  Do you have a fever, pain , or abdominal swelling? No. Pain Score  0 *  Have you tolerated food without any problems? Yes.    Have you been able to return to your normal activities? Yes.    Do you have any questions about your discharge instructions: Diet   No. Medications  No. Follow up visit  No.  Do you have questions or concerns about your Care? No.  Actions: * If pain score is 4 or above: No action needed, pain <4.

## 2024-03-11 ENCOUNTER — Other Ambulatory Visit: Payer: Self-pay | Admitting: Family Medicine

## 2024-03-11 DIAGNOSIS — E559 Vitamin D deficiency, unspecified: Secondary | ICD-10-CM

## 2024-03-11 DIAGNOSIS — E1169 Type 2 diabetes mellitus with other specified complication: Secondary | ICD-10-CM

## 2024-03-14 ENCOUNTER — Other Ambulatory Visit (INDEPENDENT_AMBULATORY_CARE_PROVIDER_SITE_OTHER)

## 2024-03-14 DIAGNOSIS — E559 Vitamin D deficiency, unspecified: Secondary | ICD-10-CM

## 2024-03-14 DIAGNOSIS — E1169 Type 2 diabetes mellitus with other specified complication: Secondary | ICD-10-CM

## 2024-03-14 LAB — VITAMIN D 25 HYDROXY (VIT D DEFICIENCY, FRACTURES): VITD: 32.46 ng/mL (ref 30.00–100.00)

## 2024-03-14 LAB — MICROALBUMIN / CREATININE URINE RATIO
Creatinine,U: 167.1 mg/dL
Microalb Creat Ratio: 7.9 mg/g (ref 0.0–30.0)
Microalb, Ur: 1.3 mg/dL (ref 0.0–1.9)

## 2024-03-14 LAB — COMPREHENSIVE METABOLIC PANEL WITH GFR
ALT: 21 U/L (ref 0–35)
AST: 18 U/L (ref 0–37)
Albumin: 4 g/dL (ref 3.5–5.2)
Alkaline Phosphatase: 99 U/L (ref 39–117)
BUN: 16 mg/dL (ref 6–23)
CO2: 27 meq/L (ref 19–32)
Calcium: 9.1 mg/dL (ref 8.4–10.5)
Chloride: 103 meq/L (ref 96–112)
Creatinine, Ser: 0.65 mg/dL (ref 0.40–1.20)
GFR: 96.3 mL/min (ref >60.00)
Glucose, Bld: 110 mg/dL — ABNORMAL HIGH (ref 70–99)
Potassium: 4.2 meq/L (ref 3.5–5.1)
Sodium: 138 meq/L (ref 135–145)
Total Bilirubin: 0.3 mg/dL (ref 0.2–1.2)
Total Protein: 7.9 g/dL (ref 6.0–8.3)

## 2024-03-14 LAB — VITAMIN B12: Vitamin B-12: 520 pg/mL (ref 211–911)

## 2024-03-14 LAB — LIPID PANEL
Cholesterol: 178 mg/dL (ref 0–200)
HDL: 73.3 mg/dL (ref >39.00)
LDL Cholesterol: 81 mg/dL (ref 0–99)
NonHDL: 104.39
Total CHOL/HDL Ratio: 2
Triglycerides: 117 mg/dL (ref 0.0–149.0)
VLDL: 23.4 mg/dL (ref 0.0–40.0)

## 2024-03-14 LAB — HEMOGLOBIN A1C: Hgb A1c MFr Bld: 6.7 % — ABNORMAL HIGH (ref 4.6–6.5)

## 2024-03-19 ENCOUNTER — Ambulatory Visit: Payer: Self-pay | Admitting: Family Medicine

## 2024-03-21 ENCOUNTER — Ambulatory Visit (INDEPENDENT_AMBULATORY_CARE_PROVIDER_SITE_OTHER): Payer: Self-pay | Admitting: Family Medicine

## 2024-03-21 ENCOUNTER — Encounter: Payer: Self-pay | Admitting: Family Medicine

## 2024-03-21 VITALS — BP 130/78 | HR 86 | Temp 98.1°F | Ht 65.0 in | Wt 206.5 lb

## 2024-03-21 DIAGNOSIS — E559 Vitamin D deficiency, unspecified: Secondary | ICD-10-CM

## 2024-03-21 DIAGNOSIS — Z0000 Encounter for general adult medical examination without abnormal findings: Secondary | ICD-10-CM

## 2024-03-21 DIAGNOSIS — H401133 Primary open-angle glaucoma, bilateral, severe stage: Secondary | ICD-10-CM | POA: Diagnosis not present

## 2024-03-21 DIAGNOSIS — E1169 Type 2 diabetes mellitus with other specified complication: Secondary | ICD-10-CM

## 2024-03-21 MED ORDER — VITAMIN D (ERGOCALCIFEROL) 1.25 MG (50000 UNIT) PO CAPS: 50000.0000 [IU] | ORAL_CAPSULE | ORAL | 3 refills | Status: AC

## 2024-03-21 NOTE — Patient Instructions (Addendum)
 Regresar para nurse visit for Tdap y shingrix  Gusto verla hoy Considere Ozempic, Trulicity o Mounjaro medicinas para diabetes y perdida de [place].  Regresar en 6 meses para seguimiento de diabetes.

## 2024-03-21 NOTE — Assessment & Plan Note (Signed)
 Reviewed healthy diet and lifestyle changes to effect sustainable weight loss.

## 2024-03-21 NOTE — Assessment & Plan Note (Addendum)
 Restart vit D weekly replacement.

## 2024-03-21 NOTE — Assessment & Plan Note (Signed)
 Preventative protocols reviewed and updated unless pt declined. Discussed healthy diet and lifestyle.
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
## 2024-03-21 NOTE — Assessment & Plan Note (Addendum)
 Chronic, stable off medication. Continue working on diet control.  Patient is interested in [place]. Reviewed mechanism of action of medication as well as side effects and adverse events to watch for including nausea, diarrhea, constipation, pancreatitis. No fmhx medullary thyroid cancer or MEN2. Reviewed importance of strength training to prevent muscle loss. She will consider options and let me know

## 2024-03-21 NOTE — Assessment & Plan Note (Signed)
 Cotinues seeing Dr Octavia

## 2024-04-04 DIAGNOSIS — R7309 Other abnormal glucose: Secondary | ICD-10-CM | POA: Diagnosis not present

## 2024-04-04 DIAGNOSIS — H47092 Other disorders of optic nerve, not elsewhere classified, left eye: Secondary | ICD-10-CM | POA: Diagnosis not present

## 2024-04-04 DIAGNOSIS — H401133 Primary open-angle glaucoma, bilateral, severe stage: Secondary | ICD-10-CM | POA: Diagnosis not present

## 2024-04-04 DIAGNOSIS — H11132 Conjunctival pigmentations, left eye: Secondary | ICD-10-CM | POA: Diagnosis not present

## 2024-04-20 ENCOUNTER — Ambulatory Visit (INDEPENDENT_AMBULATORY_CARE_PROVIDER_SITE_OTHER)

## 2024-04-20 DIAGNOSIS — Z23 Encounter for immunization: Secondary | ICD-10-CM | POA: Diagnosis not present

## 2024-04-20 NOTE — Progress Notes (Signed)
 Per orders of Dr. Anton Blas, injection of shingrix and tdap given by Laray Arenas in bilateral deltoid. Patient tolerated injection well. Patient will make appointment for 4 month for 2nd shingrix.

## 2024-08-09 ENCOUNTER — Ambulatory Visit

## 2024-09-19 ENCOUNTER — Ambulatory Visit: Admitting: Family Medicine
